# Patient Record
Sex: Female | Born: 1960 | Race: Black or African American | Hispanic: No | Smoking: Never smoker
Health system: Southern US, Community
[De-identification: ages and names within clinical notes are randomized; demographics above are authoritative.]

## PROBLEM LIST (undated history)

## (undated) DIAGNOSIS — D649 Anemia, unspecified: Secondary | ICD-10-CM

---

## 2007-09-29 ENCOUNTER — Emergency Department: Payer: Self-pay | Admitting: Emergency Medicine

## 2011-01-09 ENCOUNTER — Emergency Department (HOSPITAL_COMMUNITY)
Admission: EM | Admit: 2011-01-09 | Discharge: 2011-01-09 | Payer: Self-pay | Source: Home / Self Care | Admitting: Emergency Medicine

## 2012-03-22 ENCOUNTER — Encounter (HOSPITAL_COMMUNITY): Payer: Self-pay

## 2012-03-22 ENCOUNTER — Emergency Department (HOSPITAL_COMMUNITY)
Admission: EM | Admit: 2012-03-22 | Discharge: 2012-03-22 | Disposition: A | Discharge status: Home Health | Payer: Managed Care, Other (non HMO) | Attending: Emergency Medicine | Admitting: Emergency Medicine

## 2012-03-22 ENCOUNTER — Emergency Department (HOSPITAL_COMMUNITY): Payer: Managed Care, Other (non HMO)

## 2012-03-22 DIAGNOSIS — R059 Cough, unspecified: Secondary | ICD-10-CM

## 2012-03-22 DIAGNOSIS — D649 Anemia, unspecified: Secondary | ICD-10-CM | POA: Insufficient documentation

## 2012-03-22 DIAGNOSIS — J40 Bronchitis, not specified as acute or chronic: Secondary | ICD-10-CM

## 2012-03-22 DIAGNOSIS — R05 Cough: Secondary | ICD-10-CM

## 2012-03-22 HISTORY — DX: Anemia, unspecified: D64.9

## 2012-03-22 MED ORDER — AZITHROMYCIN 250 MG PO TABS: 250.0000 mg | ORAL_TABLET | Freq: Every day | ORAL | Status: AC

## 2012-03-22 MED ORDER — BENZONATATE 100 MG PO CAPS: 100.0000 mg | ORAL_CAPSULE | Freq: Three times a day (TID) | ORAL | Status: AC

## 2012-03-22 NOTE — ED Notes (Signed)
Cough with lt. Eye pain for 3 days

## 2012-03-22 NOTE — Discharge Instructions (Signed)
Cough, Adult  A cough is a reflex that helps clear your throat and airways. It can help heal the body or may be a reaction to an irritated airway. A cough may only last 2 or 3 weeks (acute) or may last more than 8 weeks (chronic).  CAUSES Acute cough:  Viral or bacterial infections.  Chronic cough:  Infections.   Allergies.   Asthma.   Post-nasal drip.   Smoking.   Heartburn or acid reflux.   Some medicines.   Chronic lung problems (COPD).   Cancer.  SYMPTOMS   Cough.   Fever.   Chest pain.   Increased breathing rate.   High-pitched whistling sound when breathing (wheezing).   Colored mucus that you cough up (sputum).  TREATMENT   A bacterial cough may be treated with antibiotic medicine.   A viral cough must run its course and will not respond to antibiotics.   Your caregiver may recommend other treatments if you have a chronic cough.  HOME CARE INSTRUCTIONS   Only take over-the-counter or prescription medicines for pain, discomfort, or fever as directed by your caregiver. Use cough suppressants only as directed by your caregiver.   Use a cold steam vaporizer or humidifier in your bedroom or home to help loosen secretions.   Sleep in a semi-upright position if your cough is worse at night.   Rest as needed.   Stop smoking if you smoke.  SEEK IMMEDIATE MEDICAL CARE IF:   You have pus in your sputum.   Your cough starts to worsen.   You cannot control your cough with suppressants and are losing sleep.   You begin coughing up blood.   You have difficulty breathing.   You develop pain which is getting worse or is uncontrolled with medicine.   You have a fever.  MAKE SURE YOU:   Understand these instructions.   Will watch your condition.   Will get help right away if you are not doing well or get worse.  Document Released: 06/03/2011 Document Revised: 11/24/2011 Document Reviewed: 06/03/2011 ExitCare Patient Information 2012 Exit[employer],  LL[employer].  RESOURCE GUIDE  Dental Problems  Patients with Medicaid: [facility]                     [facility] [address]                                           [address] Phone:  [phone]                                                   Phone:  [phone]  If unable to pay or uninsured, contact:  [facility] or [facility] to become qualified for the adult dental clinic.  Chronic Pain Problems Contact [facility]  [phone] Patients need to be referred by their primary care doctor.  Insufficient Money for Medicine Contact [employer]:  call "211" or [facility] 271-5999.  No Primary Care Doctor Call [facility]  [phone] Other agencies that provide inexpensive medical care    [facility]  [phone]    [facility]  [phone]      [facility]  [phone]    [facility]  [phone]    [facility]  [phone]    [facility]  [phone]  Psychological Services [facility]  [phone] [facility]  [phone] [facility]   [phone] (emergency services [phone])  Abuse/Neglect [place] [facility] Abuse Hotline (336) 641-3795 [place] [facility] Abuse Hotline 800-378-5315 (After Hours)  Emergency Shelter [facility] (336) 271-5985  [facility] (336) 275-9566 [facility] (704) 372-4663  MRSA Hotline #:   832-7006    [place] [facility]  [facility] [facility]  [employer]                           [facility] [address] [place]                     [address]         [address]  [place]                                               [place]                              [place] Phone:  [phone]                                  Phone:  [phone]                   Phone:   [phone]  [facility] Phone:  [phone]  [place] Child Abuse Hotline (336) 342-1394 (336) 342-3537 (After Hours)  

## 2012-03-22 NOTE — ED Notes (Signed)
Discharge instructions reviewed with pt; verbalizes understanding.  No questions asked; no further c/o's voiced.  Pt ambulatory to lobby.  NAD noted. 

## 2012-03-22 NOTE — ED Provider Notes (Signed)
History     CSN: [id number]  Arrival date & time 03/22/12  1710   First MD Initiated Contact with Patient 03/22/12 1729      Chief Complaint  Patient presents with  . Cough    (Consider location/radiation/quality/duration/timing/severity/associated sxs/prior treatment) HPI  50yoF previously healthy (no PMD) cough. Patient states she's experienced nonproductive cough for the past 2 days. She states her cough is getting worse. She's been taking TheraFlu without relief. She states that she feels short of breath only when coughing and that she's had several episodes of posttussive emesis. She denies abdominal pain, nausea, vomiting otherwise. She denies fevers, chills. There've been no sick contacts. She does complain of rhinorrhea without sore throat.  She c/o "headache behind my left eye". No change in vision. Denies eye pain, redness  ED Notes, ED Provider Notes from 03/22/12 0000 to 03/22/12 17:22:46       Kathe Becton, RN 03/22/2012 17:20      Cough with lt. Eye pain for 3 days     Past Medical History  Diagnosis Date  . Anemia     History reviewed. No pertinent past surgical history.  No family history on file.  History  Substance Use Topics  . Smoking status: Never Smoker   . Smokeless tobacco: Not on file  . Alcohol Use: No    OB History    Grav Para Term Preterm Abortions TAB SAB Ect Mult Living                  Review of Systems  All other systems reviewed and are negative.  except as noted HPI  Allergies  Review of patient's allergies indicates no known allergies.  Home Medications   Current Outpatient Rx  Name Route Sig Dispense Refill  . THERAFLU FLU/COLD PO Oral Take 1 packet by mouth at bedtime as needed.    . AZITHROMYCIN 250 MG PO TABS Oral Take 1 tablet (250 mg total) by mouth daily. Take first 2 tablets together, then 1 every day until finished. 6 tablet 0  . BENZONATATE 100 MG PO CAPS Oral Take 1 capsule (100 mg total) by mouth every 8 (eight)  hours. 21 capsule 0    BP 143/91  Pulse 81  Temp(Src) 98.9 F (37.2 C) (Oral)  Resp 18  SpO2 98%  LMP 12/23/2011  Physical Exam  Nursing note and vitals reviewed. Constitutional: She is oriented to person, place, and time. She appears well-developed.  HENT:  Head: Atraumatic.  Mouth/Throat: Oropharynx is clear and moist.       +nasal congestion  Eyes: Conjunctivae and EOM are normal. Pupils are equal, round, and reactive to light. Right eye exhibits no discharge. Left eye exhibits no discharge. No scleral icterus.  Neck: Normal range of motion. Neck supple.  Cardiovascular: Normal rate, regular rhythm, normal heart sounds and intact distal pulses.   Pulmonary/Chest: Effort normal and breath sounds normal. No respiratory distress. She has no wheezes. She has no rales.  Abdominal: Soft. She exhibits no distension. There is no tenderness. There is no rebound and no guarding.  Musculoskeletal: Normal range of motion.  Neurological: She is alert and oriented to person, place, and time.  Skin: Skin is warm and dry. No rash noted.  Psychiatric: She has a normal mood and affect.    ED Course  Procedures (including critical care time)  Labs Reviewed - No data to display Dg Chest 2 View  03/22/2012  *RADIOLOGY REPORT*  Clinical Data: Cough for  3 days.  CHEST - 2 VIEW  Comparison: None  Findings: The cardiac silhouette, mediastinal and hilar contours are within normal limits.  The lungs are clear.  No pleural effusion.  The bony thorax is intact.  IMPRESSION: No acute cardiopulmonary findings.  Original Report Authenticated By: P. Loralie Champagne, M.D.     1. Cough   2. Bronchitis       MDM  Cough x 3 days. Afebrile. VSS. CXR pending. Reassess.   CXR without pna. Azithro, tessalon, tylenol prn headache. Needs pmd referral.        Forbes Cellar, MD 03/22/12 1900

## 2012-12-30 ENCOUNTER — Encounter (HOSPITAL_COMMUNITY): Payer: Self-pay | Admitting: Emergency Medicine

## 2012-12-30 ENCOUNTER — Emergency Department (HOSPITAL_COMMUNITY)
Admission: EM | Admit: 2012-12-30 | Discharge: 2012-12-30 | Disposition: A | Discharge status: Home Health | Payer: Managed Care, Other (non HMO) | Attending: Emergency Medicine | Admitting: Emergency Medicine

## 2012-12-30 DIAGNOSIS — M79609 Pain in unspecified limb: Secondary | ICD-10-CM

## 2012-12-30 DIAGNOSIS — Z79899 Other long term (current) drug therapy: Secondary | ICD-10-CM | POA: Insufficient documentation

## 2012-12-30 DIAGNOSIS — L02419 Cutaneous abscess of limb, unspecified: Secondary | ICD-10-CM | POA: Insufficient documentation

## 2012-12-30 DIAGNOSIS — Z862 Personal history of diseases of the blood and blood-forming organs and certain disorders involving the immune mechanism: Secondary | ICD-10-CM | POA: Insufficient documentation

## 2012-12-30 DIAGNOSIS — L0390 Cellulitis, unspecified: Secondary | ICD-10-CM

## 2012-12-30 LAB — CBC WITH DIFFERENTIAL/PLATELET
Basophils Absolute: 0 10*3/uL (ref 0.0–0.1)
Basophils Relative: 0 % (ref 0–1)
Eosinophils Relative: 3 % (ref 0–5)
HCT: 32.4 % — ABNORMAL LOW (ref 36.0–46.0)
MCH: 26.9 pg (ref 26.0–34.0)
MCHC: 33.3 g/dL (ref 30.0–36.0)
MCV: 80.6 fL (ref 78.0–100.0)
Monocytes Absolute: 0.4 10*3/uL (ref 0.1–1.0)
RDW: 14.9 % (ref 11.5–15.5)

## 2012-12-30 LAB — BASIC METABOLIC PANEL
CO2: 23 mEq/L (ref 19–32)
Calcium: 9.2 mg/dL (ref 8.4–10.5)
Creatinine, Ser: 0.67 mg/dL (ref 0.50–1.10)
GFR calc Af Amer: >90 mL/min (ref >90)

## 2012-12-30 MED ORDER — ONDANSETRON 4 MG PO TBDP
ORAL_TABLET | ORAL | Status: AC
  Administered 2012-12-30: 4 mg
  Filled 2012-12-30: qty 1

## 2012-12-30 MED ORDER — ONDANSETRON HCL 4 MG/2ML IJ SOLN
4.0000 mg | Freq: Once | INTRAMUSCULAR | Status: AC
  Administered 2012-12-30: 4 mg via INTRAVENOUS
  Filled 2012-12-30: qty 2

## 2012-12-30 MED ORDER — SULFAMETHOXAZOLE-TRIMETHOPRIM 800-160 MG PO TABS: 1.0000 | ORAL_TABLET | Freq: Two times a day (BID) | ORAL | Status: DC

## 2012-12-30 MED ORDER — CEPHALEXIN 500 MG PO CAPS: 500.0000 mg | ORAL_CAPSULE | Freq: Four times a day (QID) | ORAL | Status: DC

## 2012-12-30 MED ORDER — OXYCODONE-ACETAMINOPHEN 5-325 MG PO TABS
2.0000 | ORAL_TABLET | Freq: Once | ORAL | Status: AC
  Administered 2012-12-30: 2 via ORAL
  Filled 2012-12-30: qty 2

## 2012-12-30 MED ORDER — ONDANSETRON HCL 4 MG PO TABS: 4.0000 mg | ORAL_TABLET | Freq: Four times a day (QID) | ORAL | Status: DC

## 2012-12-30 MED ORDER — CLINDAMYCIN PHOSPHATE 600 MG/50ML IV SOLN
600.0000 mg | Freq: Once | INTRAVENOUS | Status: AC
  Administered 2012-12-30: 600 mg via INTRAVENOUS
  Filled 2012-12-30: qty 50

## 2012-12-30 MED ORDER — HYDROCODONE-ACETAMINOPHEN 5-325 MG PO TABS: 2.0000 | ORAL_TABLET | ORAL | Status: DC | PRN

## 2012-12-30 NOTE — ED Provider Notes (Signed)
History  This chart was scribed for Chloe Octave, MD by Chloe Campbell, ED Scribe. This patient was seen in room B16C/B16C and the patient's care was started at 9:45 AM.  CSN: [id number]  Arrival date & time 12/30/12  4098   First MD Initiated Contact with Patient 12/30/12 0945      Chief Complaint  Patient presents with  . Leg Pain     The history is provided by the patient. No language interpreter was used.    Chloe Campbell is a 52 y.o. female who presents to the Emergency Department complaining of 3 days of gradual onset, gradually worsening, constant right lower leg redness with associated mild calf pain and warmth. She reports that the area originally started off as a small bump after shaving which came to a head 2 days ago. She states that she popped the head and had white purulent discharge expelled. She states that since then the area has continued to drainage and has gotten more red and painful. She states that the pain is aggravated by ambulating and improved with rest. She reports taking Excedrin with mild improvement in her pain. She denies CP, abdominal pain, fevers, nausea and emesis as associated symptoms. She has a h/o chronic anemia. She denies smoking, alcohol use and IV drug use.  Past Medical History  Diagnosis Date  . Anemia     History reviewed. No pertinent past surgical history.  History reviewed. No pertinent family history.  History  Substance Use Topics  . Smoking status: Never Smoker   . Smokeless tobacco: Not on file  . Alcohol Use: No     Review of Systems  A complete 10 system review of systems was obtained and all systems are negative except as noted in the HPI and PMH.   Allergies  Review of patient's allergies indicates no known allergies.  Home Medications   Current Outpatient Rx  Name  Route  Sig  Dispense  Refill  . CEPHALEXIN 500 MG PO CAPS   Oral   Take 1 capsule (500 mg total) by mouth 4 (four) times daily.   40 capsule   0   . HYDROCODONE-ACETAMINOPHEN 5-325 MG PO TABS   Oral   Take 2 tablets by mouth every 4 (four) hours as needed for pain.   6 tablet   0   . ONDANSETRON HCL 4 MG PO TABS   Oral   Take 1 tablet (4 mg total) by mouth every 6 (six) hours.   12 tablet   0   . SULFAMETHOXAZOLE-TRIMETHOPRIM 800-160 MG PO TABS   Oral   Take 1 tablet by mouth every 12 (twelve) hours.   20 tablet   0     Triage Vitals: BP 118/72  Pulse 62  Temp 98.2 F (36.8 C) (Oral)  Resp 16  Ht 5' 4.5" (1.638 m)  Wt 150 lb (68.04 kg)  BMI 25.35 kg/m2  SpO2 98%  Physical Exam  Nursing note and vitals reviewed. Constitutional: She is oriented to person, place, and time. She appears well-developed and well-nourished. No distress.  HENT:  Head: Normocephalic and atraumatic.  Eyes: Conjunctivae normal and EOM are normal. Pupils are equal, round, and reactive to light.  Neck: Normal range of motion. Neck supple. No tracheal deviation present.  Cardiovascular: Normal rate and regular rhythm.   Pulmonary/Chest: Effort normal and breath sounds normal. No respiratory distress.  Abdominal: Soft. There is no tenderness.  Musculoskeletal: Normal range of motion. She exhibits no edema and no  tenderness.  Neurological: She is alert and oriented to person, place, and time.  Skin: Skin is warm and dry.       6 cm area of erythema to right lower leg, central ruptured bola with serous drainage, 2+ DP and PT pulses, no calf tenderness or asymmetry  Psychiatric: She has a normal mood and affect. Her behavior is normal.    ED Course  Procedures (including critical care time)  DIAGNOSTIC STUDIES: Oxygen Saturation is 98% on room air, normal by my interpretation.    COORDINATION OF CARE: 9:50 AM-Discussed treatment plan which includes IV antibiotics and pain medications with pt at bedside and pt agreed to plan.   10:00 AM- Ordered 600 mg clindamycin IVBP and 2 5-325 Norco tablets  10:54 AM- Pt rechecked and is c/o  nausea but states that pain is improved with medications listed above. Will order antiemetic. Informed pt of negative lab work and discussed discharge plan of recheck in 2 to 3 days. Performed US at bedside, no abscess or fluid collection to drain. Pt is agreeable to plan.  11:00 AM- Ordered 4 mg Zofran injection   Labs Reviewed  CBC WITH DIFFERENTIAL - Abnormal; Notable for the following:    Hemoglobin 10.8 (*)     HCT 32.4 (*)     All other components within normal limits  BASIC METABOLIC PANEL - Abnormal; Notable for the following:    Potassium 3.4 (*)     Glucose, Bld 104 (*)     All other components within normal limits   No results found.   1. Cellulitis       MDM  Right leg pain for the past 4 days after picking at a lesion. No fever or vomiting. No weakness, numbness or tingling.. Cellulitis noted to right lower leg. Neurovascular intact distally no fluid collection on bedside ultrasound.  Given clindamycin in ED for cellulitis. WOund edges marked. No evidence of DVT.  Patient instructed to have recheck with PCP in 48 hours. Return to the ED sooner if fever, spreading redness, increasing pain or other concern.  I personally performed the services described in this documentation, which was scribed in my presence. The recorded information has been reviewed and is accurate.        Chloe Octave, MD 12/30/12 1758

## 2012-12-30 NOTE — Progress Notes (Signed)
VASCULAR LAB PRELIMINARY  PRELIMINARY  PRELIMINARY  PRELIMINARY  Right lower extremity venous Doppler completed.    Preliminary report:  There is no DVT or SVT noted in the right lower extremity.  Nkosi Cortright, RVT 12/30/2012, 10:27 AM

## 2012-12-30 NOTE — ED Notes (Signed)
During discharge process patient started vomiting. Dr Manus Gunning notified, ordered to give zofran ODT.

## 2012-12-30 NOTE — ED Notes (Signed)
Pt discharged to home with family. NAD.  

## 2012-12-30 NOTE — ED Notes (Signed)
Pt presents to ED via POV with c/o right leg pain. Pt reports she saw a white bump on her right lower leg Thursday and "picked at it" and applied alcohol and peroxide. Right lower leg noted to be red warm to touch and swollen. Small dime size sore noted with clear drainage. NAD. Right foot swollen.

## 2012-12-31 ENCOUNTER — Emergency Department (HOSPITAL_COMMUNITY)
Admission: EM | Admit: 2012-12-31 | Discharge: 2012-12-31 | Disposition: A | Discharge status: Home / Self Care | Payer: Managed Care, Other (non HMO) | Attending: Emergency Medicine | Admitting: Emergency Medicine

## 2012-12-31 ENCOUNTER — Encounter (HOSPITAL_COMMUNITY): Payer: Self-pay

## 2012-12-31 DIAGNOSIS — R110 Nausea: Secondary | ICD-10-CM | POA: Insufficient documentation

## 2012-12-31 DIAGNOSIS — Z79899 Other long term (current) drug therapy: Secondary | ICD-10-CM | POA: Insufficient documentation

## 2012-12-31 DIAGNOSIS — L02419 Cutaneous abscess of limb, unspecified: Secondary | ICD-10-CM | POA: Insufficient documentation

## 2012-12-31 DIAGNOSIS — Z862 Personal history of diseases of the blood and blood-forming organs and certain disorders involving the immune mechanism: Secondary | ICD-10-CM | POA: Insufficient documentation

## 2012-12-31 DIAGNOSIS — L0390 Cellulitis, unspecified: Secondary | ICD-10-CM

## 2012-12-31 DIAGNOSIS — L02415 Cutaneous abscess of right lower limb: Secondary | ICD-10-CM

## 2012-12-31 MED ORDER — IBUPROFEN 400 MG PO TABS
600.0000 mg | ORAL_TABLET | Freq: Once | ORAL | Status: AC
  Administered 2012-12-31: 600 mg via ORAL
  Filled 2012-12-31: qty 1

## 2012-12-31 MED ORDER — CEPHALEXIN 250 MG PO CAPS
500.0000 mg | ORAL_CAPSULE | Freq: Once | ORAL | Status: AC
  Administered 2012-12-31: 500 mg via ORAL
  Filled 2012-12-31: qty 2

## 2012-12-31 NOTE — ED Provider Notes (Signed)
History     CSN: [id number]  Arrival date & time 12/31/12  1532   First MD Initiated Contact with Patient 12/31/12 2112      Chief Complaint  Patient presents with  . Cellulitis    (Consider location/radiation/quality/duration/timing/severity/associated sxs/prior treatment) HPI Comments: Pt states that she first noticed the abscess on Thursday of last week when she showered and there was pain when the water hit her leg.  She said that it became increasingly painful after that and that on Friday she could no longer tolerate putting weight on the leg.  She said that there was a spot that looked like a pimple and that she "popped it" and that pus like material came out of it Friday night.  The leg became increasingly painful, swollen, and red over the weekend and she sought care last night in the ER where she was told that she had cellulitis and was given a prescription for Keflex.  She did not fill the prescription and the pain got worse today, prompting her to return to the ER.  She states that she has never had anything like this before and that standing seems to make the pain much worse.    The history is provided by the patient.    Past Medical History  Diagnosis Date  . Anemia     History reviewed. No pertinent past surgical history.  No family history on file.  History  Substance Use Topics  . Smoking status: Never Smoker   . Smokeless tobacco: Not on file  . Alcohol Use: No    OB History    Grav Para Term Preterm Abortions TAB SAB Ect Mult Living                  Review of Systems  Constitutional: Negative for fever and chills.  Gastrointestinal: Positive for nausea. Negative for vomiting.       PT states that she has felt nauseous as of today.  Skin: Positive for color change and wound.       Rt lower leg is erythematous, swollen, and has an approximately 1 cm wound in the center of the affected area.  Neurological: Negative for headaches.  All other systems  reviewed and are negative.    Allergies  Review of patient's allergies indicates no known allergies.  Home Medications   Current Outpatient Rx  Name  Route  Sig  Dispense  Refill  . CEPHALEXIN 500 MG PO CAPS   Oral   Take 1 capsule (500 mg total) by mouth 4 (four) times daily.   40 capsule   0   . HYDROCODONE-ACETAMINOPHEN 5-325 MG PO TABS   Oral   Take 2 tablets by mouth every 4 (four) hours as needed for pain.   6 tablet   0   . ONDANSETRON HCL 4 MG PO TABS   Oral   Take 1 tablet (4 mg total) by mouth every 6 (six) hours.   12 tablet   0   . SULFAMETHOXAZOLE-TRIMETHOPRIM 800-160 MG PO TABS   Oral   Take 1 tablet by mouth every 12 (twelve) hours.   20 tablet   0     BP 119/59  Pulse 65  Temp 98.8 F (37.1 C) (Oral)  Resp 18  SpO2 97%  LMP 12/23/2011  Physical Exam  Nursing note and vitals reviewed. Constitutional: She appears well-developed and well-nourished.  HENT:  Head: Normocephalic and atraumatic.  Eyes: Pupils are equal, round, and reactive to light.  Neck: Normal range of motion.  Cardiovascular: Normal rate and regular rhythm.   Pulmonary/Chest: Effort normal.  Musculoskeletal: Normal range of motion. She exhibits edema and tenderness.       Legs: Skin: Skin is warm. There is erythema.       Erythematous, swollen, painful right lower leg.    ED Course  INCISION AND DRAINAGE Date/Time: 12/31/2012 9:51 PM Performed by: Arman Filter Authorized by: Arman Filter Consent: Verbal consent obtained. Consent given by: patient Patient understanding: patient states understanding of the procedure being performed Patient consent: the patient's understanding of the procedure matches consent given Procedure consent: procedure consent matches procedure scheduled Test results: test results available and properly labeled Site marked: the operative site was marked Patient identity confirmed: verbally with patient Time out: Immediately prior to  procedure a "time out" was called to verify the correct patient, procedure, equipment, support staff and site/side marked as required. Type: abscess Body area: lower extremity Location details: right leg Anesthesia: local infiltration Local anesthetic: lidocaine 2% with epinephrine Anesthetic total: 4 ml Patient sedated: no Scalpel size: 11 Needle gauge: 22 (27 gauge) Incision type: single straight Complexity: simple Drainage: bloody Drainage amount: moderate Wound treatment: wound left open Packing material: 1/4 in iodoform gauze Patient tolerance: Patient tolerated the procedure well with no immediate complications. Comments: Pt was locally anesthetized with 4 mL of 2% lidocaine w epinephrine and a straight incision was made over the center of the abscess.  Hemostats were used to open and break up loculations and a mix of blood and purulent material was drained from the site.  The site was then packed with 1/4 iodoform gauze and covered with gauze.  Pt instructed to take Keflex as prescribed yesterday.   (including critical care time)  Labs Reviewed - No data to display No results found.   1. Abscess of right lower leg   2. Cellulitis       MDM  Cellulitis not improved, now abscess.  I and D performed with moderate amount of exudate. DC'd home and encouraged to fill Rx for Keflex and to remove packing in two days.      Arman Filter, NP 12/31/12 2212  Arman Filter, NP 12/31/12 1610  Arman Filter, NP 12/31/12 2213

## 2012-12-31 NOTE — ED Notes (Signed)
Pt was seen here yesterday and dx with cellulitis to her right lower leg. Now presents with open wound to the lateral lower leg which is weeping. Was given an antibiotic yesterday. Back for the increase in pain, redness and swelling as well as weeping.

## 2013-01-01 NOTE — ED Provider Notes (Signed)
Medical screening examination/treatment/procedure(s) were performed by non-physician practitioner and as supervising physician I was immediately available for consultation/collaboration.  Christopher J. Pollina, MD 01/01/13 2359 

## 2013-01-03 ENCOUNTER — Emergency Department (HOSPITAL_COMMUNITY)
Admission: EM | Admit: 2013-01-03 | Discharge: 2013-01-03 | Disposition: A | Discharge status: Home / Self Care | Payer: Managed Care, Other (non HMO) | Attending: Emergency Medicine | Admitting: Emergency Medicine

## 2013-01-03 ENCOUNTER — Encounter (HOSPITAL_COMMUNITY): Payer: Self-pay

## 2013-01-03 DIAGNOSIS — L02415 Cutaneous abscess of right lower limb: Secondary | ICD-10-CM

## 2013-01-03 DIAGNOSIS — L02419 Cutaneous abscess of limb, unspecified: Secondary | ICD-10-CM | POA: Insufficient documentation

## 2013-01-03 DIAGNOSIS — Z4801 Encounter for change or removal of surgical wound dressing: Secondary | ICD-10-CM | POA: Insufficient documentation

## 2013-01-03 DIAGNOSIS — Z862 Personal history of diseases of the blood and blood-forming organs and certain disorders involving the immune mechanism: Secondary | ICD-10-CM | POA: Insufficient documentation

## 2013-01-03 DIAGNOSIS — L03119 Cellulitis of unspecified part of limb: Secondary | ICD-10-CM | POA: Insufficient documentation

## 2013-01-03 MED ORDER — CLINDAMYCIN HCL 150 MG PO CAPS: 150.0000 mg | ORAL_CAPSULE | Freq: Four times a day (QID) | ORAL | Status: DC

## 2013-01-03 MED ORDER — LIDOCAINE-EPINEPHRINE 2 %-1:100000 IJ SOLN
10.0000 mL | Freq: Once | INTRAMUSCULAR | Status: AC
  Administered 2013-01-03: 10 mL via INTRADERMAL
  Filled 2013-01-03: qty 10

## 2013-01-03 MED ORDER — HYDROCODONE-ACETAMINOPHEN 5-325 MG PO TABS: 2.0000 | ORAL_TABLET | ORAL | Status: DC | PRN

## 2013-01-03 NOTE — ED Notes (Signed)
Pt has open draining wound, approx the size of a nickel on right tibial area.

## 2013-01-03 NOTE — ED Notes (Signed)
PA at bedside.

## 2013-01-03 NOTE — ED Provider Notes (Signed)
History     CSN: [id number]  Arrival date & time 01/03/13  1143   First MD Initiated Contact with Patient 01/03/13 1330      No chief complaint on file.   (Consider location/radiation/quality/duration/timing/severity/associated sxs/prior treatment) HPI  52 year old female presents for wound recheck.  Pt has an abscess to R anterior tibia region which was I&D 3 days ago with packing placed.  She has been taking Keflex.  Pt was told to remove packing in 2 days, which she did.  Currently c/o increasing pain, and swelling to affected site, difficult to walk due to pain.  Onset gradual, persistent, moderate in severity, non radiating.  Denies fever, chills, recent trauma, hx of recurrent abscess or hx of diabetes.    Past Medical History  Diagnosis Date  . Anemia     History reviewed. No pertinent past surgical history.  History reviewed. No pertinent family history.  History  Substance Use Topics  . Smoking status: Never Smoker   . Smokeless tobacco: Not on file  . Alcohol Use: No    OB History    Grav Para Term Preterm Abortions TAB SAB Ect Mult Living                  Review of Systems  Constitutional:       10 Systems reviewed and all are negative for acute change except as noted in the HPI.     Allergies  Review of patient's allergies indicates no known allergies.  Home Medications   Current Outpatient Rx  Name  Route  Sig  Dispense  Refill  . CEPHALEXIN 500 MG PO CAPS   Oral   Take 500 mg by mouth 4 (four) times daily. Starting on 12/30/12         . ONDANSETRON HCL 4 MG PO TABS   Oral   Take 4 mg by mouth every 6 (six) hours as needed. For nausea         . SULFAMETHOXAZOLE-TMP DS 800-160 MG PO TABS   Oral   Take 1 tablet by mouth every 12 (twelve) hours. Starting 12/30/12           BP 103/72  Pulse 64  Temp 97.6 F (36.4 C) (Oral)  Resp 18  SpO2 100%  LMP 12/23/2011  Physical Exam  Nursing note and vitals reviewed. Constitutional: She  is oriented to person, place, and time. She appears well-developed and well-nourished. No distress.  HENT:  Head: Atraumatic.  Neck: Neck supple.  Musculoskeletal: Normal range of motion. She exhibits tenderness (R anterior tibia region: a dime size lesion without pustular exudates or significant fluctuance.  ttp).  Neurological: She is alert and oriented to person, place, and time.  Skin: Skin is warm.  Psychiatric: She has a normal mood and affect.    ED Course  Procedures (including critical care time)  Labs Reviewed - No data to display No results found.   No diagnosis found.  INCISION AND DRAINAGE Performed by: Fayrene Helper Consent: Verbal consent obtained. Risks and benefits: risks, benefits and alternatives were discussed Type: abscess  Body area: R lower leg  Anesthesia: local infiltration  Incision was made with a scalpel.  Local anesthetic: lidocaine 2% w epinephrine  Anesthetic total: 6 ml  Complexity: complex Blunt dissection to break up loculations  Drainage: blood  Drainage amount: mild  Packing material: 1/4 in iodoform gauze  Patient tolerance: Patient tolerated the procedure well with no immediate complications.   1. Abscess, R  lower leg  MDM  Pt is here for recheck of abscess to R lower leg s/p I&D 3 days ago.  Pt reports worsening swelling and pain, and request re-I&D.  Will perform I&D again.    2:38 PM Wound were re I&D without pustular exudates.  Packing placed.  Will switch Keflex to Clindamycin.  Recommend warm compress, rest, remove packing in 2 days and return for further evaluation if pain persists.  Pt voice understanding and agrees with plan.     BP 103/72  Pulse 64  Temp 97.6 F (36.4 C) (Oral)  Resp 18  SpO2 100%  LMP 12/23/2011  I have reviewed nursing notes and vital signs.  I reviewed available ER/hospitalization records thought the EMR    Fayrene Helper, [place] 01/03/13 1518

## 2013-01-03 NOTE — ED Notes (Signed)
Pt presents for follow evaluation of abscess to R lower leg.  Pt seen here on Monday, reports compliance with abx, reports packing was pulled yesterday.  Pt reports area continues to drain, reports another area above site that has become sore, denies drainage to 2nd site.

## 2013-01-04 NOTE — ED Provider Notes (Signed)
Medical screening examination/treatment/procedure(s) were performed by non-physician practitioner and as supervising physician I was immediately available for consultation/collaboration.   Loren Racer, MD 01/04/13 [phone]

## 2013-06-26 ENCOUNTER — Emergency Department: Payer: Self-pay | Admitting: Emergency Medicine

## 2013-09-10 ENCOUNTER — Encounter (HOSPITAL_COMMUNITY): Payer: Self-pay | Admitting: *Deleted

## 2013-09-10 ENCOUNTER — Emergency Department (HOSPITAL_COMMUNITY)
Admission: EM | Admit: 2013-09-10 | Discharge: 2013-09-10 | Discharge status: Against Medical Advice | Payer: Managed Care, Other (non HMO) | Attending: Emergency Medicine | Admitting: Emergency Medicine

## 2013-09-10 DIAGNOSIS — K644 Residual hemorrhoidal skin tags: Secondary | ICD-10-CM | POA: Insufficient documentation

## 2013-09-10 DIAGNOSIS — Z792 Long term (current) use of antibiotics: Secondary | ICD-10-CM | POA: Insufficient documentation

## 2013-09-10 DIAGNOSIS — Z862 Personal history of diseases of the blood and blood-forming organs and certain disorders involving the immune mechanism: Secondary | ICD-10-CM | POA: Insufficient documentation

## 2013-09-10 LAB — COMPREHENSIVE METABOLIC PANEL
AST: 20 U/L (ref 0–37)
Albumin: 3.9 g/dL (ref 3.5–5.2)
Chloride: 107 mEq/L (ref 96–112)
Creatinine, Ser: 0.79 mg/dL (ref 0.50–1.10)
Total Bilirubin: 0.2 mg/dL — ABNORMAL LOW (ref 0.3–1.2)
Total Protein: 7.4 g/dL (ref 6.0–8.3)

## 2013-09-10 LAB — CBC
MCV: 81 fL (ref 78.0–100.0)
Platelets: 263 10*3/uL (ref 150–400)
RDW: 15.9 % — ABNORMAL HIGH (ref 11.5–15.5)
WBC: 4.3 10*3/uL (ref 4.0–10.5)

## 2013-09-10 NOTE — ED Notes (Signed)
Pt is here with complaints of 3 day history of passing blood rectally.  Pt denies any stool.  Pt states yesterday she has more clots.  Pt is having lower abdominal pain

## 2013-09-10 NOTE — ED Provider Notes (Signed)
CSN: [id number]     Arrival date & time 09/10/13  1810 History   First MD Initiated Contact with Patient 09/10/13 2049     No chief complaint on file.  (Consider location/radiation/quality/duration/timing/severity/associated sxs/prior Treatment) HPI Comments: Pt comes in with cc of blood per rectum x 3 days. She typically notices the blood when she wipes herself. There is no diarrhea, no hx of GERD, no abd pain, no heavy ASA use, alcohol abuse, liver dz. No hx of constipation.   The history is provided by the patient.    Past Medical History  Diagnosis Date  . Anemia    History reviewed. No pertinent past surgical history. No family history on file. History  Substance Use Topics  . Smoking status: Never Smoker   . Smokeless tobacco: Not on file  . Alcohol Use: No   OB History   Grav Para Term Preterm Abortions TAB SAB Ect Mult Living                 Review of Systems  Constitutional: Positive for activity change.  HENT: Negative for neck pain.   Respiratory: Negative for shortness of breath.   Cardiovascular: Negative for chest pain.  Gastrointestinal: Positive for blood in stool. Negative for nausea, vomiting and abdominal pain.  Genitourinary: Negative for dysuria.  Neurological: Negative for headaches.    Allergies  Review of patient's allergies indicates no known allergies.  Home Medications   Current Outpatient Rx  Name  Route  Sig  Dispense  Refill  . clindamycin (CLEOCIN) 150 MG capsule   Oral   Take 1 capsule (150 mg total) by mouth every 6 (six) hours.   14 capsule   0    BP 116/58  Pulse 67  Temp(Src) 98.3 F (36.8 C) (Oral)  Resp 16  SpO2 99%  LMP 12/23/2011 Physical Exam  Nursing note and vitals reviewed. Constitutional: She is oriented to person, place, and time. She appears well-developed and well-nourished.  HENT:  Head: Normocephalic and atraumatic.  Eyes: EOM are normal. Pupils are equal, round, and reactive to light.  Neck: Neck  supple.  Cardiovascular: Normal rate, regular rhythm and normal heart sounds.   No murmur heard. Pulmonary/Chest: Effort normal. No respiratory distress.  Abdominal: Soft. She exhibits no distension. There is no tenderness. There is no rebound and no guarding.  DRE reveals large hemorrhoidal skin tags with an area of bloody appearing mucosa. The DRE reveals no melena, no BRBPR  Neurological: She is alert and oriented to person, place, and time.  Skin: Skin is warm and dry.    ED Course  Procedures (including critical care time) Labs Review Labs Reviewed  CBC - Abnormal; Notable for the following:    RBC 3.84 (*)    Hemoglobin 10.2 (*)    HCT 31.1 (*)    RDW 15.9 (*)    All other components within normal limits  COMPREHENSIVE METABOLIC PANEL - Abnormal; Notable for the following:    Alkaline Phosphatase 137 (*)    Total Bilirubin 0.2 (*)    All other components within normal limits  OCCULT BLOOD, POC DEVICE - Abnormal; Notable for the following:    Fecal Occult Bld POSITIVE (*)    All other components within normal limits   Imaging Review No results found.  MDM   1. Hemorrhoidal skin tags    DDx includes: PUD/Gastritis/ulcers Diverticular bleed Colon cancer Rectal bleed Internal hemorrhoids External hemorrhoids  Pt's exam consistent with external hemorrhoids. Her abd exam  is benign. Advocated SITZ bath and Stool softners. Hb is stable.  11:30 PM Pt had left prior to nurses discharge. Left after MD evaluation.   Derwood Kaplan, MD 09/10/13 2330

## 2014-02-16 ENCOUNTER — Encounter (HOSPITAL_COMMUNITY): Payer: Self-pay | Admitting: Emergency Medicine

## 2014-02-16 ENCOUNTER — Emergency Department (HOSPITAL_COMMUNITY)
Admission: EM | Admit: 2014-02-16 | Discharge: 2014-02-17 | Disposition: A | Discharge status: Home / Self Care | Payer: Managed Care, Other (non HMO) | Attending: Emergency Medicine | Admitting: Emergency Medicine

## 2014-02-16 DIAGNOSIS — R1011 Right upper quadrant pain: Secondary | ICD-10-CM | POA: Insufficient documentation

## 2014-02-16 DIAGNOSIS — R1013 Epigastric pain: Secondary | ICD-10-CM | POA: Insufficient documentation

## 2014-02-16 DIAGNOSIS — Z862 Personal history of diseases of the blood and blood-forming organs and certain disorders involving the immune mechanism: Secondary | ICD-10-CM | POA: Insufficient documentation

## 2014-02-16 DIAGNOSIS — R42 Dizziness and giddiness: Secondary | ICD-10-CM | POA: Insufficient documentation

## 2014-02-16 LAB — CBC WITH DIFFERENTIAL/PLATELET
BASOS ABS: 0 10*3/uL (ref 0.0–0.1)
BASOS PCT: 0 % (ref 0–1)
Eosinophils Absolute: 0.1 10*3/uL (ref 0.0–0.7)
Eosinophils Relative: 3 % (ref 0–5)
HCT: 32 % — ABNORMAL LOW (ref 36.0–46.0)
Hemoglobin: 10 g/dL — ABNORMAL LOW (ref 12.0–15.0)
Lymphocytes Relative: 45 % (ref 12–46)
Lymphs Abs: 2 10*3/uL (ref 0.7–4.0)
MCH: 22.7 pg — ABNORMAL LOW (ref 26.0–34.0)
MCHC: 31.3 g/dL (ref 30.0–36.0)
MCV: 72.7 fL — ABNORMAL LOW (ref 78.0–100.0)
Monocytes Absolute: 0.3 10*3/uL (ref 0.1–1.0)
Monocytes Relative: 7 % (ref 3–12)
NEUTROS ABS: 2.1 10*3/uL (ref 1.7–7.7)
NEUTROS PCT: 46 % (ref 43–77)
PLATELETS: 271 10*3/uL (ref 150–400)
RBC: 4.4 MIL/uL (ref 3.87–5.11)
RDW: 20.8 % — AB (ref 11.5–15.5)
WBC: 4.6 10*3/uL (ref 4.0–10.5)

## 2014-02-16 LAB — COMPREHENSIVE METABOLIC PANEL
ALBUMIN: 3.7 g/dL (ref 3.5–5.2)
ALK PHOS: 134 U/L — AB (ref 39–117)
ALT: 15 U/L (ref 0–35)
AST: 17 U/L (ref 0–37)
BUN: 16 mg/dL (ref 6–23)
CHLORIDE: 95 meq/L — AB (ref 96–112)
CO2: 27 mEq/L (ref 19–32)
Calcium: 9.4 mg/dL (ref 8.4–10.5)
Creatinine, Ser: 0.71 mg/dL (ref 0.50–1.10)
GFR calc Af Amer: >90 mL/min (ref >90)
GFR calc non Af Amer: >90 mL/min (ref >90)
Glucose, Bld: 94 mg/dL (ref 70–99)
POTASSIUM: 4.2 meq/L (ref 3.7–5.3)
Sodium: 146 mEq/L (ref 137–147)
Total Bilirubin: 0.2 mg/dL — ABNORMAL LOW (ref 0.3–1.2)
Total Protein: 7.7 g/dL (ref 6.0–8.3)

## 2014-02-16 LAB — I-STAT TROPONIN, ED: TROPONIN I, POC: 0.01 ng/mL (ref 0.00–0.08)

## 2014-02-16 LAB — LIPASE, BLOOD: Lipase: 19 U/L (ref 11–59)

## 2014-02-16 MED ORDER — FAMOTIDINE 20 MG PO TABS
20.0000 mg | ORAL_TABLET | Freq: Once | ORAL | Status: AC
  Administered 2014-02-16: 20 mg via ORAL
  Filled 2014-02-16: qty 1

## 2014-02-16 MED ORDER — GI COCKTAIL ~~LOC~~
30.0000 mL | Freq: Once | ORAL | Status: AC
  Administered 2014-02-16: 30 mL via ORAL
  Filled 2014-02-16: qty 30

## 2014-02-16 MED ORDER — ESOMEPRAZOLE MAGNESIUM 40 MG PO CPDR: 40.0000 mg | DELAYED_RELEASE_CAPSULE | Freq: Every day | ORAL | Status: DC

## 2014-02-16 MED ORDER — HYDROCODONE-ACETAMINOPHEN 5-325 MG PO TABS
1.0000 | ORAL_TABLET | Freq: Once | ORAL | Status: AC
  Administered 2014-02-17: 1 via ORAL
  Filled 2014-02-16: qty 1

## 2014-02-16 NOTE — ED Notes (Signed)
Patient with abdominal pain and lightheadness for last two weeks.  Patient states denies any nausea or vomiting at this time.

## 2014-02-16 NOTE — Discharge Instructions (Signed)
Return for chest pain, sweating, worsening pain, pain radiating to the back, fevers or other concerns. If you were given medicines take as directed.  If you are on coumadin or contraceptives realize their levels and effectiveness is altered by many different medicines.  If you have any reaction (rash, tongues swelling, other) to the medicines stop taking and see a physician.   Please follow up as directed and return to the ER or see a physician for new or worsening symptoms.  Thank you.

## 2014-02-16 NOTE — ED Notes (Signed)
Pt reports epigastric pain "burning" off and on over past two-three weeks.  Mylanta and pepto bismol does not help.  Eating and drinking makes stomach burn worse.

## 2014-02-16 NOTE — ED Notes (Signed)
Pt reports GI Cocktail improving her epigastric pain.

## 2014-02-16 NOTE — ED Notes (Signed)
Patient asked for urine sample. States that she is unable at this time.

## 2014-02-16 NOTE — ED Notes (Signed)
Pt given water po.  Drinking without difficulty.  Reports epigastric region continues to burn, now rates 5/10.

## 2014-02-16 NOTE — ED Provider Notes (Signed)
CSN: [id number]     Arrival date & time 02/16/14  2007 History   First MD Initiated Contact with Patient 02/16/14 2209     Chief Complaint  Patient presents with  . Dizziness  . Abdominal Pain     (Consider location/radiation/quality/duration/timing/severity/associated sxs/prior Treatment) HPI Comments: 53 yo female with no significant medical hx, no cardiac hx, no ulcer hx presents with epig burning discomfort, constant for two days but has had for two weeks, worse after eating.  Mild radiation to central chest, no back radiation or tearing, mild hx of similar.  Minimal nsaids.  No ulcer hx.  Non smoker.  No cardiac risk factors.  No hx of gerd known.  No GB hx.  Patient is a 53 y.o. female presenting with dizziness and abdominal pain. The history is provided by the patient.  Dizziness Associated symptoms: no blood in stool, no chest pain, no headaches, no nausea, no shortness of breath and no vomiting   Abdominal Pain Associated symptoms: no chest pain, no chills, no dysuria, no fever, no nausea, no shortness of breath and no vomiting     Past Medical History  Diagnosis Date  . Anemia    History reviewed. No pertinent past surgical history. History reviewed. No pertinent family history. History  Substance Use Topics  . Smoking status: Never Smoker   . Smokeless tobacco: Not on file  . Alcohol Use: No   OB History   Grav Para Term Preterm Abortions TAB SAB Ect Mult Living                 Review of Systems  Constitutional: Negative for fever and chills.  HENT: Negative for congestion.   Eyes: Negative for visual disturbance.  Respiratory: Negative for shortness of breath.   Cardiovascular: Negative for chest pain.  Gastrointestinal: Positive for abdominal pain. Negative for nausea, vomiting and blood in stool.  Genitourinary: Negative for dysuria and flank pain.  Musculoskeletal: Negative for back pain, neck pain and neck stiffness.  Skin: Negative for rash.   Neurological: Positive for light-headedness. Negative for dizziness and headaches.      Allergies  Review of patient's allergies indicates no known allergies.  Home Medications   Current Outpatient Rx  Name  Route  Sig  Dispense  Refill  . bismuth subsalicylate (PEPTO BISMOL) 262 MG/15ML suspension   Oral   Take 30 mLs by mouth every 6 (six) hours as needed for indigestion.         Marland Kitchen ibuprofen (ADVIL,MOTRIN) 200 MG tablet   Oral   Take 200 mg by mouth every 6 (six) hours as needed for fever or moderate pain.          BP 154/77  Temp(Src) 97.5 F (36.4 C) (Oral)  Resp 18  Ht 5\' 4"  (1.626 m)  SpO2 97%  LMP 12/22/2005 Physical Exam  Nursing note and vitals reviewed. Constitutional: She is oriented to person, place, and time. She appears well-developed and well-nourished.  HENT:  Head: Normocephalic and atraumatic.  Eyes: Conjunctivae are normal. Right eye exhibits no discharge. Left eye exhibits no discharge.  Neck: Normal range of motion. Neck supple. No tracheal deviation present.  Cardiovascular: Normal rate, regular rhythm and intact distal pulses.   No murmur heard. Pulmonary/Chest: Effort normal and breath sounds normal.  Abdominal: Soft. She exhibits no distension. There is tenderness (epig). There is no guarding.  Musculoskeletal: She exhibits no edema.  Neurological: She is alert and oriented to person, place, and time.  Skin:  Skin is warm. No rash noted.  Psychiatric: She has a normal mood and affect.    ED Course  Procedures (including critical care time)  EMERGENCY DEPARTMENT BILIARY ULTRASOUND INTERPRETATION "Study: Limited Abdominal Ultrasound of the gallbladder and common bile duct."  INDICATIONS: Abdominal pain and RUQ pain Indication: Multiple views of the gallbladder and common bile duct were obtained in real-time with a Multi-frequency probe." PERFORMED BY:  Myself IMAGES ARCHIVED?: Yes FINDINGS: Gallstones absent, Gallbladder wall normal in  thickness, Sonographic Murphy's sign absent and Common bile duct normal in size LIMITATIONS: Bowel Gas INTERPRETATION: Normal  EMERGENCY DEPARTMENT ULTRASOUND  Study: Limited Retroperitoneal Ultrasound of the Abdominal Aorta.  INDICATIONS:Abdominal pain and Age>55 Multiple views of the abdominal aorta were obtained in real-time from the diaphragmatic hiatus to the aortic bifurcation in transverse planes with a multi-frequency probe. PERFORMED BY: Myself IMAGES ARCHIVED?: Yes FINDINGS: Maximum aortic dimensions are normal LIMITATIONS:  Bowel gas INTERPRETATION:  No abdominal aortic aneurysm    Labs Review Labs Reviewed  CBC WITH DIFFERENTIAL - Abnormal; Notable for the following:    Hemoglobin 10.0 (*)    HCT 32.0 (*)    MCV 72.7 (*)    MCH 22.7 (*)    RDW 20.8 (*)    All other components within normal limits  COMPREHENSIVE METABOLIC PANEL - Abnormal; Notable for the following:    Chloride 95 (*)    Alkaline Phosphatase 134 (*)    Total Bilirubin 0.2 (*)    All other components within normal limits  LIPASE, BLOOD  PREGNANCY, URINE  I-STAT TROPOININ, ED  POC URINE PREG, ED   Imaging Review No results found.   EKG Interpretation   Date/Time:  Sunday February 16 2014 22:42:17 EST Ventricular Rate:  58 PR Interval:  142 QRS Duration: 84 QT Interval:  410 QTC Calculation: 403 R Axis:   -13 Text Interpretation:  Sinus rhythm Left ventricular hypertrophy No acute  findings Confirmed by Roni Scow  MD, Renezmae Canlas ([id number]) on 02/16/2014 11:06:07 PM      MDM   Final diagnoses:  Epigastric pain   Clinically gerd or gastric origin, possibly biliary and less likely cardiac or emergent cause with constant sxs 2 days, well appearing and worse with eating.  [facility], both unremarkable. Screening ekg and troponin.  GI meds given.  Pt improved however burning pain persists, norco. Discussed pcp/ GI fup and strict reasons to return.  With pain constant for 2  wks I do not feel she needs emergent CT or admission at this time. Results and differential diagnosis were discussed with the patient. Close follow up outpatient was discussed, patient comfortable with the plan.     Enid SkeensJoshua M Dorse Locy, MD 02/16/14 [phone]

## 2014-04-29 ENCOUNTER — Emergency Department: Payer: Self-pay | Admitting: Emergency Medicine

## 2014-04-30 ENCOUNTER — Encounter (HOSPITAL_COMMUNITY): Payer: Self-pay | Admitting: Emergency Medicine

## 2014-04-30 ENCOUNTER — Emergency Department (HOSPITAL_COMMUNITY)
Admission: EM | Admit: 2014-04-30 | Discharge: 2014-04-30 | Disposition: A | Discharge status: Home / Self Care | Payer: Managed Care, Other (non HMO) | Attending: Emergency Medicine | Admitting: Emergency Medicine

## 2014-04-30 DIAGNOSIS — Z862 Personal history of diseases of the blood and blood-forming organs and certain disorders involving the immune mechanism: Secondary | ICD-10-CM | POA: Insufficient documentation

## 2014-04-30 DIAGNOSIS — M5430 Sciatica, unspecified side: Secondary | ICD-10-CM | POA: Insufficient documentation

## 2014-04-30 DIAGNOSIS — Z79899 Other long term (current) drug therapy: Secondary | ICD-10-CM | POA: Insufficient documentation

## 2014-04-30 MED ORDER — HYDROCODONE-ACETAMINOPHEN 5-325 MG PO TABS: 1.0000 | ORAL_TABLET | ORAL | Status: DC | PRN

## 2014-04-30 MED ORDER — PREDNISONE 20 MG PO TABS
60.0000 mg | ORAL_TABLET | Freq: Once | ORAL | Status: AC
  Administered 2014-04-30: 60 mg via ORAL
  Filled 2014-04-30: qty 3

## 2014-04-30 MED ORDER — CYCLOBENZAPRINE HCL 10 MG PO TABS: 10.0000 mg | ORAL_TABLET | Freq: Two times a day (BID) | ORAL | Status: DC | PRN

## 2014-04-30 MED ORDER — PREDNISONE 10 MG PO TABS: 20.0000 mg | ORAL_TABLET | Freq: Every day | ORAL | Status: DC

## 2014-04-30 NOTE — ED Notes (Signed)
Refused to wait after medication given.

## 2014-04-30 NOTE — ED Notes (Signed)
Pt states lower right back pain that is going into her buttox.  Pt reports no change in urinary habits.  Pt report walking or reaching for something makes the pain worse

## 2014-04-30 NOTE — ED Provider Notes (Signed)
CSN: [id number]     Arrival date & time 04/30/14  1949 History    Chief Complaint  Patient presents with  . Back Pain   Patient is a 53 y.o. female presenting with back pain.  Back Pain Associated symptoms: no weakness    This chart was scribed for non-physician practitioner working with No att. providers found, by Andrew Auaven Small, ED Scribe. This patient was seen in room TR09C/TR09C and the patient's care was started at 7:07 AM.  Concepcion ElkDeborah Somerville is a 53 y.o. female who presents to the Emergency Department complaining of lower back pain onset 3 days ago. Pt reports that pain began at work. 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 She reports that she was walking when she first felt the pain.  She reports that this is a new issue. She reports that. She describes the pain as throbbing. Pt reports that pain radiates down right leg and stops at her knee. Pt denies weakness in right leg.   She reports she has a limp with walking.  Pt denies BM, HTN and asthma. Pt reports that she drove herself to the ED.  Pt does not have PCP.   Past Medical History  Diagnosis Date  . Anemia    History reviewed. No pertinent past surgical history. No family history on file. History  Substance Use Topics  . Smoking status: Never Smoker   . Smokeless tobacco: Not on file  . Alcohol Use: No   OB History   Grav Para Term Preterm Abortions TAB SAB Ect Mult Living                 Review of Systems  Musculoskeletal: Positive for back pain.  Neurological: Negative for weakness.    Allergies  Review of patient's allergies indicates no known allergies.  Home Medications   Prior to Admission medications   Medication Sig Start Date End Date Taking? Authorizing Provider  bismuth subsalicylate (PEPTO BISMOL) 262 MG/15ML suspension Take 30 mLs by mouth every 6 (six) hours as needed for indigestion.    Historical Provider, MD  esomeprazole (NEXIUM) 40 MG capsule Take 1 capsule (40  mg total) by mouth daily. 02/16/14   Enid SkeensJoshua M Zavitz, MD  ibuprofen (ADVIL,MOTRIN) 200 MG tablet Take 200 mg by mouth every 6 (six) hours as needed for fever or moderate pain.    Historical Provider, MD   BP 128/80  Pulse 60  Temp(Src) 98.3 F (36.8 C) (Oral)  Resp 19  Ht 5' 4.5" (1.638 m)  Wt 184 lb (83.462 kg)  BMI 31.11 kg/m2  SpO2 99%  LMP 12/22/2005 Physical Exam  Nursing note and vitals reviewed. Constitutional: She is oriented to person, place, and time. She appears well-developed and well-nourished. No distress.  HENT:  Head: Normocephalic and atraumatic.  Eyes: EOM are normal.  Neck: Neck supple.  Cardiovascular: Normal rate.   Pulmonary/Chest: Effort normal. No respiratory distress.  Musculoskeletal: Normal range of motion.  Pt has equal strength to bilateral lower extremities.  Neurosensory function adequate to both legs No clonus on dorsiflextion Skin color is normal. Skin is warm and moist.  I see no step off deformity, no midline bony tenderness.  Pt is able to ambulate.  No crepitus, laceration, effusion, induration, lesions, swelling.   Pedal pulses are symmetrical and palpable bilaterally  no tenderness to palpation of paraspinel muscles or midline tenderness of her right hip   Neurological: She is alert and oriented to  person, place, and time.  Skin: Skin is warm and dry.  Psychiatric: She has a normal mood and affect. Her behavior is normal.   ED Course  Procedures  COORDINATION OF CARE: 7:07 AM-Discussed treatment plan which includes 5 day dose of prednisone and a muscle relaxer. Pt is to come back if symptoms worsen including fever and chills.   Labs Review Labs Reviewed - No data to display  Imaging Review No results found.   EKG Interpretation None      MDM   Final diagnoses:  Sciatica    52 y.o.Albertina Parreborah Pretty's  with back pain. No neurological deficits and normal neuro exam. Patient can walk but states is painful. No loss of bowel or  bladder control. No concern for cauda equina. No fever, night sweats, weight loss, h/o cancer, IVDU. RICE protocol and pain medicine indicated and discussed with patient.   Patient Plan 1. Medications: pain medication, muscle relaxer and usual home medications  2. Treatment: rest, drink plenty of fluids, gentle stretching as discussed, alternate ice and heat  3. Follow Up: Please followup with your primary doctor for discussion of your diagnoses and further evaluation after today's visit; if you do not have a primary care doctor use the resource guide provided to find one   Vital signs are stable at discharge. Filed Vitals:   04/30/14 2158  BP: 128/80  Pulse: 60  Temp: 98.3 F (36.8 C)  Resp: 19    Patient/guardian has voiced understanding and agreed to follow-up with the PCP or specialist.     I personally performed the services described in this documentation, which was scribed in my presence. The recorded information has been reviewed and is accurate.     Dorthula Matasiffany G Jachelle Fluty, PA-C 05/03/14 [phone]

## 2014-04-30 NOTE — Discharge Instructions (Signed)
Sciatica °Sciatica is pain, weakness, numbness, or tingling along the path of the sciatic nerve. The nerve starts in the lower back and runs down the back of each leg. The nerve controls the muscles in the lower leg and in the back of the knee, while also providing sensation to the back of the thigh, lower leg, and the sole of your foot. Sciatica is a symptom of another medical condition. For instance, nerve damage or certain conditions, such as a herniated disk or bone spur on the spine, pinch or put pressure on the sciatic nerve. This causes the pain, weakness, or other sensations normally associated with sciatica. Generally, sciatica only affects one side of the body. °CAUSES  °· Herniated or slipped disc. °· Degenerative disk disease. °· A pain disorder involving the narrow muscle in the buttocks (piriformis syndrome). °· Pelvic injury or fracture. °· Pregnancy. °· Tumor (rare). °SYMPTOMS  °Symptoms can vary from mild to very severe. The symptoms usually travel from the low back to the buttocks and down the back of the leg. Symptoms can include: °· Mild tingling or dull aches in the lower back, leg, or hip. °· Numbness in the back of the calf or sole of the foot. °· Burning sensations in the lower back, leg, or hip. °· Sharp pains in the lower back, leg, or hip. °· Leg weakness. °· Severe back pain inhibiting movement. °These symptoms may get worse with coughing, sneezing, laughing, or prolonged sitting or standing. Also, being overweight may worsen symptoms. °DIAGNOSIS  °Your caregiver will perform a physical exam to look for common symptoms of sciatica. He or she may ask you to do certain movements or activities that would trigger sciatic nerve pain. Other tests may be performed to find the cause of the sciatica. These may include: °· Blood tests. °· X-rays. °· Imaging tests, such as an MRI or CT scan. °TREATMENT  °Treatment is directed at the cause of the sciatic pain. Sometimes, treatment is not necessary  and the pain and discomfort goes away on its own. If treatment is needed, your caregiver may suggest: °· Over-the-counter medicines to relieve pain. °· Prescription medicines, such as anti-inflammatory medicine, muscle relaxants, or narcotics. °· Applying heat or ice to the painful area. °· Steroid injections to lessen pain, irritation, and inflammation around the nerve. °· Reducing activity during periods of pain. °· Exercising and stretching to strengthen your abdomen and improve flexibility of your spine. Your caregiver may suggest losing weight if the extra weight makes the back pain worse. °· Physical therapy. °· Surgery to eliminate what is pressing or pinching the nerve, such as a bone spur or part of a herniated disk. °HOME CARE INSTRUCTIONS  °· Only take over-the-counter or prescription medicines for pain or discomfort as directed by your caregiver. °· Apply ice to the affected area for 20 minutes, 3 4 times a day for the first 48 72 hours. Then try heat in the same way. °· Exercise, stretch, or perform your usual activities if these do not aggravate your pain. °· Attend physical therapy sessions as directed by your caregiver. °· Keep all follow-up appointments as directed by your caregiver. °· Do not wear high heels or shoes that do not provide proper support. °· Check your mattress to see if it is too soft. A firm mattress may lessen your pain and discomfort. °SEEK IMMEDIATE MEDICAL CARE IF:  °· You lose control of your bowel or bladder (incontinence). °· You have increasing weakness in the lower back,   pelvis, buttocks, or legs.  You have redness or swelling of your back.  You have a burning sensation when you urinate.  You have pain that gets worse when you lie down or awakens you at night.  Your pain is worse than you have experienced in the past.  Your pain is lasting longer than 4 weeks.  You are suddenly losing weight without reason. MAKE SURE YOU:  Understand these  instructions.  Will watch your condition.  Will get help right away if you are not doing well or get worse. Document Released: 11/29/2001 Document Revised: 06/05/2012 Document Reviewed: 04/15/2012 [facility] Patient Information 2014 [place].  RESOURCE GUIDE  Chronic Pain Problems: Contact [facility]  [phone] Patients need to be referred by their primary care doctor.  Insufficient Money for Medicine: Contact United Way:  call "211" or [place] [phone].  No Primary Care Doctor: Call [facility]  [phone] - can help you locate a primary care doctor that  accepts your insurance, provides certain services, etc. Physician Referral Service- [phone]  Agencies that provide inexpensive medical care: [facility]  [facility]  [phone] [facility]  [phone] [facility]  [phone] Planned [facility]  [phone] [facility]  [phone]  [place] Providers: [facility]- [address], [address]  [phone], Mon-Fri 9am-7pm, Sat 9am-1pm [place], [address] [place], [address] [place]  Bureau- [address]  [place], [address], [phone]  Only accepts [place] patients after they have their name  applied to their card  Self Pay (no insurance) in [facility]: Sickle Cell Patients: Dr Kevan Ny, [facility]  [place], [facility]- [place]  [facility]- [address], [facility]- see information above (Speak to [employer] if you do not have insurance)       -  [facility]- [place], [place],  [place], [place]  Dr Vista Lawman-  [address], [place], [place] [facility]- [address], [phone]       -  [facility]- [address], [place], also [address], [phone]       -    [facility]- [address], [place], 1st & 3rd Saturday   every month, 10am-1pm  1) Find a Doctor and Pay Out of Pocket Although you won't have to find out who is covered by your insurance plan, it is a good idea to ask around and get recommendations. You will then need to call the office and see if the doctor you have chosen will accept you as a new patient and what types of options they offer for patients who are self-pay. Some doctors offer discounts or will set up payment  plans for their patients who do not have insurance, but you will need to ask so you aren't surprised when you get to your appointment.  2) Contact Your Local Health Department Not all health departments have doctors that can see patients for sick visits, but many do, so it is worth a call to see if yours does. If you don't know where your local health department is, you can check in your phone book. The CDC also has a tool to help you locate your state's health department, and many state websites also have listings of all of their local health departments.  3) Find a Walk-in Clinic If your illness is not likely to be very severe or complicated, you may want to try a walk in clinic. These are popping up all over the country in pharmacies, drugstores, and shopping centers. They're usually staffed by nurse practitioners or physician assistants that have been trained to treat common illnesses and complaints. They're usually fairly quick and inexpensive. However, if you have serious medical issues or chronic medical problems, these are probably not your best  option  STD Testing [facility] Department of [facility], STD Clinic, [address], [place], phone [phone] or [phone].  Monday - Friday, call for an appointment. [facility] Department of [employer], STD Clinic, [place] [address], [place], phone [phone] or [phone].  Monday - Friday, call for an appointment.  Abuse/Neglect: [facility] Child Abuse Hotline (501) 855-5805 [facility] Child Abuse Hotline 765 306 5119 (After Hours)  Emergency Shelter:  [facility] [phone]  Maternity Homes: Room at the [place] [facility] [phone] [facility] [phone]  MRSA Hotline #:   (254)311-9591  [facility] Resources  [facility] [place]  United Way [facility] Dept. [address]                 [address]         [address] [place] [address]  Blondell Reveal Phone:  [phone]                                  Phone:  [phone]                   Phone:  [phone]  [facility], [phone] [facility]- [phone]       -     [facility] in [place], [address],                                  [phone], [facility] Child Abuse Hotline 7737753484 or 6502867281 (After Hours)   Behavioral Health Services  Substance Abuse Resources: Alcohol and Drug Services  [phone] [facility] [phone] [facility] [place] [phone] Floydene Flock [phone] Residential & Outpatient Substance  Abuse Program  [phone]  Psychological Services: [place]  [phone] [facility]  [phone] [facility], [place] [address] [place], [place]: [phone] or [phone],  [email or web address]  Dental Assistance  If unable to pay or uninsured, contact:  [facility] or [facility]. to become qualified for the adult dental clinic.  Patients with Medicaid: [facility] [phone] [address] [place], [place] [address], [phone]  If unable to pay, or uninsured, contact [facility] ([phone]) or [place] [phone] in [place], [place] in [facility]) to become qualified for the adult dental clinic  Other [place]- [place], [place], [place], [address], [place], [place], 2nd and 4th Thursday of the month at 6:30am.  10 clients each day by appointment, can sometimes see walk-in patients if someone does not show for an appointment. [facility]- [address] [place] D[place]er [place], [place], [address], [place], [place], [place], [address], [place] [facility] [phone]  Please make every effort to establish with a primary care physician for routine medical care  [place]  The [place] provides a wide range of adult health services. Some of these services are designed to address the healthcare needs of all [facility] residents and all services are designed to meet the needs of uninsured/underinsured low income residents. Some services are available to any resident of [place], call [phone] for details. ] The [facility], a new medical clinic for adults, is now open. For more information about the Center and its services please call [phone]. For information on our [place] services, click here.  For more information on any of the following Department of Public Health  programs, including hours of service, click on the highlighted link.  SERVICES FOR WOMEN (Adults and Teens) [employer] provide a full range of birth control options plus education and counseling. New patient visit and annual return visits include a complete examination, pap test as indicated, and other laboratory as indicated. Included is our [employer] for men.  Maternity Care is provided through pregnancy, including a six week post partum exam. Women who meet eligibility criteria for the Medicaid for Pregnant Women program, receive care free. Other women are charged on a sliding scale according to income. Note: [facility] provides services to pregnant women who have a Medicaid card. Call [phone] for an appointment in [place] or [phone] for an appointment in [facility].  Primary Care for Medicaid [place] Access Women is available through the [place]. As primary care provider for the [place] program, women may designate the [facility] clinic as their primary care provider.  PLEASE CALL [phone] FOR AN APPOINTMENT FOR THE ABOVE SERVICES IN EITHER [place] OR [place]. Information available in [place] and [place].   Childbirth Education Classes are open to the public and offered to help families prepare for the best possible childbirth experience as well as to promote lifelong health and wellness. Classes are offered throughout the year and meet on the same night once a week for five weeks. Medicaid covers the cost of the classes for the mother-to-be and her partner. For participants without Medicaid, the cost of the class series is $45.00 for the mother-to-be and her  partner. Class size is limited and registration is required. For more information or to register call [phone]. Baby items donated by [employer] and the [employer] [place] are given away  during each class series.  SERVICES FOR WOMEN AND MEN Sexually Transmitted Infection appointments, including HIV testing, are available daily (weekdays, except holidays). Call early as same-day appointments are limited. For an appointment in either [facility] or [place], call [phone]. Services are confidential and free of charge.  Skin Testing for Tuberculosis Please call [phone]. Adult Immunizations are available, usually for a fee. Please call [phone] for details.  PLEASE CALL [phone] FOR AN APPOINTMENT FOR THE ABOVE SERVICES IN EITHER [place] OR [place].   [facility] provides up to the minute recommended vaccines for your travel destination. We also provide essential health and political information to help insure a safe and pleasurable travel experience. This program is self-sustaining, however, fees are very competitive. We are a CERTIFIED YELLOW FEVER IMMUNIZATION approved clinic site. PLEASE CALL [phone] FOR AN APPOINTMENT IN EITHER [place] OR [place].   If you have questions about the services listed above, we want to answer them! Email [place] at: [email or web address] Home Visiting Services for elderly and the disabled are available to residents of [facility] who are in need of care that compares to the care offered by a nursing home, have needs that can be met by the program, and have [place] Medicaid. Other short term services are available to residents 18 years and older who are unable to meet requirements for eligibility to receive services from a certified home health agency, spend the majority of time at home, and need care for six months or less.  PLEASE CALL [id number] OR [phone] FOR MORE INFORMATION. Medication Assistance Program serves as a link between pharmaceutical companies and patients to provide low cost or free prescription medications. This servce is available for residents who meet certain income restrictions and have no insurance  coverage.  PLEASE CALL [phone] ([place]) OR [phone] ([place]) FOR MORE INFORMATION.  Updated Feb. 21, 2013

## 2014-05-05 NOTE — ED Provider Notes (Signed)
Medical screening examination/treatment/procedure(s) were performed by non-physician practitioner and as supervising physician I was immediately available for consultation/collaboration.  Toy BakerAnthony T Shakima Nisley, MD 05/05/14 [phone]

## 2014-10-01 ENCOUNTER — Emergency Department (HOSPITAL_COMMUNITY): Payer: Managed Care, Other (non HMO)

## 2014-10-01 ENCOUNTER — Encounter (HOSPITAL_COMMUNITY): Payer: Self-pay | Admitting: Emergency Medicine

## 2014-10-01 ENCOUNTER — Emergency Department (HOSPITAL_COMMUNITY)
Admission: EM | Admit: 2014-10-01 | Discharge: 2014-10-01 | Disposition: A | Discharge status: Home / Self Care | Payer: Managed Care, Other (non HMO) | Attending: Emergency Medicine | Admitting: Emergency Medicine

## 2014-10-01 DIAGNOSIS — Z862 Personal history of diseases of the blood and blood-forming organs and certain disorders involving the immune mechanism: Secondary | ICD-10-CM | POA: Insufficient documentation

## 2014-10-01 DIAGNOSIS — W19XXXA Unspecified fall, initial encounter: Secondary | ICD-10-CM | POA: Diagnosis not present

## 2014-10-01 DIAGNOSIS — Y939 Activity, unspecified: Secondary | ICD-10-CM | POA: Diagnosis not present

## 2014-10-01 DIAGNOSIS — S99922A Unspecified injury of left foot, initial encounter: Secondary | ICD-10-CM | POA: Diagnosis present

## 2014-10-01 DIAGNOSIS — S92302A Fracture of unspecified metatarsal bone(s), left foot, initial encounter for closed fracture: Secondary | ICD-10-CM | POA: Diagnosis not present

## 2014-10-01 DIAGNOSIS — Y929 Unspecified place or not applicable: Secondary | ICD-10-CM | POA: Insufficient documentation

## 2014-10-01 MED ORDER — HYDROCODONE-ACETAMINOPHEN 7.5-325 MG/15ML PO SOLN
10.0000 mL | Freq: Once | ORAL | Status: AC
  Administered 2014-10-01: 10 mL via ORAL
  Filled 2014-10-01: qty 15

## 2014-10-01 MED ORDER — HYDROCODONE-ACETAMINOPHEN 5-325 MG PO TABS: 1.0000 | ORAL_TABLET | ORAL | Status: DC | PRN

## 2014-10-01 MED ORDER — IBUPROFEN 400 MG PO TABS
400.0000 mg | ORAL_TABLET | Freq: Once | ORAL | Status: AC
Start: 2014-10-01 — End: 2014-10-01
  Administered 2014-10-01: 400 mg via ORAL
  Filled 2014-10-01: qty 1

## 2014-10-01 NOTE — ED Notes (Signed)
Patient transported to X-ray 

## 2014-10-01 NOTE — Discharge Instructions (Signed)
Cryotherapy °Cryotherapy means treatment with cold. Ice or gel packs can be used to reduce both pain and swelling. Ice is the most helpful within the first 24 to 48 hours after an injury or flare-up from overusing a muscle or joint. Sprains, strains, spasms, burning pain, shooting pain, and aches can all be eased with ice. Ice can also be used when recovering from surgery. Ice is effective, has very few side effects, and is safe for most people to use. °PRECAUTIONS  °Ice is not a safe treatment option for people with: °· Raynaud phenomenon. This is a condition affecting small blood vessels in the extremities. Exposure to cold may cause your problems to return. °· Cold hypersensitivity. There are many forms of cold hypersensitivity, including: °¨ Cold urticaria. Red, itchy hives appear on the skin when the tissues begin to warm after being iced. °¨ Cold erythema. This is a red, itchy rash caused by exposure to cold. °¨ Cold hemoglobinuria. Red blood cells break down when the tissues begin to warm after being iced. The hemoglobin that carry oxygen are passed into the urine because they cannot combine with blood proteins fast enough. °· Numbness or altered sensitivity in the area being iced. °If you have any of the following conditions, do not use ice until you have discussed cryotherapy with your caregiver: °· Heart conditions, such as arrhythmia, angina, or chronic heart disease. °· High blood pressure. °· Healing wounds or open skin in the area being iced. °· Current infections. °· Rheumatoid arthritis. °· Poor circulation. °· Diabetes. °Ice slows the blood flow in the region it is applied. This is beneficial when trying to stop inflamed tissues from spreading irritating chemicals to surrounding tissues. However, if you expose your skin to cold temperatures for too long or without the proper protection, you can damage your skin or nerves. Watch for signs of skin damage due to cold. °HOME CARE INSTRUCTIONS °Follow  these tips to use ice and cold packs safely. °· Place a dry or damp towel between the ice and skin. A damp towel will cool the skin more quickly, so you may need to shorten the time that the ice is used. °· For a more rapid response, add gentle compression to the ice. °· Ice for no more than 10 to 20 minutes at a time. The bonier the area you are icing, the less time it will take to get the benefits of ice. °· Check your skin after 5 minutes to make sure there are no signs of a poor response to cold or skin damage. °· Rest 20 minutes or more between uses. °· Once your skin is numb, you can end your treatment. You can test numbness by very lightly touching your skin. The touch should be so light that you do not see the skin dimple from the pressure of your fingertip. When using ice, most people will feel these normal sensations in this order: cold, burning, aching, and numbness. °· Do not use ice on someone who cannot communicate their responses to pain, such as small children or people with dementia. °HOW TO MAKE AN ICE PACK °Ice packs are the most common way to use ice therapy. Other methods include ice massage, ice baths, and cryosprays. Muscle creams that cause a cold, tingly feeling do not offer the same benefits that ice offers and should not be used as a substitute unless recommended by your caregiver. °To make an ice pack, do one of the following: °· Place crushed ice or a   bag of frozen vegetables in a sealable plastic bag. Squeeze out the excess air. Place this bag inside another plastic bag. Slide the bag into a pillowcase or place a damp towel between your skin and the bag. °· Mix 3 parts water with 1 part rubbing alcohol. Freeze the mixture in a sealable plastic bag. When you remove the mixture from the freezer, it will be slushy. Squeeze out the excess air. Place this bag inside another plastic bag. Slide the bag into a pillowcase or place a damp towel between your skin and the bag. °SEEK MEDICAL CARE  IF: °· You develop white spots on your skin. This may give the skin a blotchy (mottled) appearance. °· Your skin turns blue or pale. °· Your skin becomes waxy or hard. °· Your swelling gets worse. °MAKE SURE YOU:  °· Understand these instructions. °· Will watch your condition. °· Will get help right away if you are not doing well or get worse. °Document Released: 08/01/2011 Document Revised: 04/21/2014 Document Reviewed: 08/01/2011 °ExitCare® Patient Information ©2015 [employer]Care[employer]. This information is not intended to replace advice given to you by your health care provider. Make sure you discuss any questions you have with your health care provider. °Metatarsal Fracture, Undisplaced °A metatarsal fracture is a break in the bone(s) of the foot. These are the bones of the foot that connect your toes to the bones of the ankle. °DIAGNOSIS  °The diagnoses of these fractures are usually made with X-rays. If there are problems in the forefoot and x-rays are normal a later bone scan will usually make the diagnosis.  °TREATMENT AND HOME CARE INSTRUCTIONS °· Treatment may or may not include a cast or walking shoe. When casts are needed the use is usually for short periods of time so as not to slow down healing with muscle wasting (atrophy). °· Activities should be stopped until further advised by your caregiver. °· Wear shoes with adequate shock absorbing capabilities and stiff soles. °· Alternative exercise may be undertaken while waiting for healing. These may include bicycling and swimming, or as your caregiver suggests. °· It is important to keep all follow-up visits or specialty referrals. The failure to keep these appointments could result in improper bone healing and chronic pain or disability. °· Warning: Do not drive a car or operate a motor vehicle until your caregiver specifically tells you it is safe to do so. °IF YOU DO NOT HAVE A CAST OR SPLINT: °· You may walk on your injured foot as tolerated or  advised. °· Do not put any weight on your injured foot for as long as directed by your caregiver. Slowly increase the amount of time you walk on the foot as the pain allows or as advised. °· Use crutches until you can bear weight without pain. A gradual increase in weight bearing may help. °· Apply ice to the injury for 15-20 minutes each hour while awake for the first 2 days. Put the ice in a plastic bag and place a towel between the bag of ice and your skin. °· Only take over-the-counter or prescription medicines for pain, discomfort, or fever as directed by your caregiver. °SEEK IMMEDIATE MEDICAL CARE IF:  °· Your cast gets damaged or breaks. °· You have continued severe pain or more swelling than you did before the cast was put on, or the pain is not controlled with medications. °· Your skin or nails below the injury turn blue or grey, or feel cold or numb. °· There   is a bad smell, or new stains or pus-like (purulent) drainage coming from the cast. °MAKE SURE YOU:  °· Understand these instructions. °· Will watch your condition. °· Will get help right away if you are not doing well or get worse. °Document Released: 08/27/2002 Document Revised: 02/27/2012 Document Reviewed: 07/18/2008 °ExitCare® Patient Information ©2015 ExitCare, [employer]. This information is not intended to replace advice given to you by your health care provider. Make sure you discuss any questions you have with your health care provider. ° °

## 2014-10-01 NOTE — ED Notes (Signed)
Declined W/C at D/C and was escorted to lobby by RN. 

## 2014-10-01 NOTE — ED Provider Notes (Signed)
Medical screening examination/treatment/procedure(s) were performed by non-physician practitioner and as supervising physician I was immediately available for consultation/collaboration.   EKG Interpretation None       Doug SouSam Fitzgerald Dunne, MD 10/01/14 2357

## 2014-10-01 NOTE — ED Provider Notes (Signed)
CSN: [id number]     Arrival date & time 10/01/14  1826 History  This chart was scribed for Elpidio AnisShari Gailya Tauer, PA-C working with Doug SouSam Jacubowitz, MD by Evon Slackerrance Branch, ED Scribe. This patient was seen in room TR08C/TR08C and the patient's care was started at 7:03 PM.      Chief Complaint  Patient presents with  . Foot Pain   Patient is a 53 y.o. female presenting with lower extremity pain. The history is provided by the patient. No language interpreter was used.  Foot Pain   HPI Comments: Chloe Campbell is a 53 y.o. female who presents to the Emergency Department complaining of left foot pain onset 1 day prior. She states she fell backwards on a flat surface. She states she caught herself with her left foot. She states she has associated swelling and slight gait problem. She states she may have rolled one her foot during the fall but is unsure how she injured the foot. She denies having ankle pain or calf pain.     Past Medical History  Diagnosis Date  . Anemia    History reviewed. No pertinent past surgical history. History reviewed. No pertinent family history. History  Substance Use Topics  . Smoking status: Never Smoker   . Smokeless tobacco: Not on file  . Alcohol Use: No   OB History   Grav Para Term Preterm Abortions TAB SAB Ect Mult Living                 Review of Systems  Musculoskeletal: Positive for arthralgias, gait problem and joint swelling.    Allergies  Review of patient's allergies indicates no known allergies.  Home Medications   Prior to Admission medications   Medication Sig Start Date End Date Taking? Authorizing Provider  Aspirin-Acetaminophen-Caffeine (GOODY HEADACHE PO) Take 1 Package by mouth daily as needed (for pain).   Yes Historical Provider, MD   Triage Vitals: BP 137/75  Pulse 68  Temp(Src) 98.9 F (37.2 C) (Oral)  Resp 18  Ht 5' 4.5" (1.638 m)  Wt 185 lb (83.915 kg)  BMI 31.28 kg/m2  SpO2 98%  LMP 12/22/2005  Physical Exam   Nursing note and vitals reviewed. Constitutional: She is oriented to person, place, and time. She appears well-developed and well-nourished. No distress.  HENT:  Head: Normocephalic and atraumatic.  Eyes: Conjunctivae and EOM are normal.  Neck: Neck supple. No tracheal deviation present.  Cardiovascular: Normal rate.   Pulses:      Dorsalis pedis pulses are 2+ on the right side, and 2+ on the left side.       Posterior tibial pulses are 2+ on the right side, and 2+ on the left side.  Pulmonary/Chest: Effort normal. No respiratory distress.  Musculoskeletal: Normal range of motion.  Left foot markedly swollen distally with bruising at base of toes 2-4 no wound, significantly tender with any motion, ankle non tender, no calf tenderness.   Neurological: She is alert and oriented to person, place, and time.  Skin: Skin is warm and dry.  Psychiatric: She has a normal mood and affect. Her behavior is normal.    ED Course  Procedures (including critical care time) DIAGNOSTIC STUDIES: Oxygen Saturation is 98% on RA, normal by my interpretation.    COORDINATION OF CARE: 7:14 PM-Discussed treatment plan which includes left foot x-ray with pt at bedside and pt agreed to plan.     Labs Review Labs Reviewed - No data to display  Imaging Review Dg Foot  Complete Left  10/01/2014   CLINICAL DATA:  Left foot pain, tripped in her house yesterday, dorsal left foot pain and swelling  EXAM: LEFT FOOT - COMPLETE 3+ VIEW  COMPARISON:  None.  FINDINGS: Three views of the left foot submitted. Mild soft tissue swelling dorsal metatarsal region. There is mild impacted nondisplaced fracture distal aspect of third metatarsal.  IMPRESSION: Mild impacted nondisplaced fracture distal aspect of third metatarsal.   Electronically Signed   By: Natasha MeadLiviu  Pop M.D.   On: 10/01/2014 20:36     EKG Interpretation None      MDM   Final diagnoses:  None   1. Fracture metatarsal, left  Nondisplaced fracture of  distal MT. CAM walker and crutches provided. Follow up with ortho. Pain improved with medication in ED.     I personally performed the services described in this documentation, which was scribed in my presence. The recorded information has been reviewed and is accurate.       Arnoldo HookerShari A Naasir Carreira, PA-C 10/01/14 2046

## 2014-10-01 NOTE — ED Notes (Signed)
Pt reports falling last night and now having left foot pain and swelling. Ambulatory on arrival.

## 2014-10-01 NOTE — Progress Notes (Signed)
Orthopedic Tech Progress Note Patient Details:  Concepcion ElkDeborah Preziosi 03-Mar-1961 [id number]  Ortho Devices Type of Ortho Device: CAM walker;Crutches Ortho Device/Splint Location: LLE Ortho Device/Splint Interventions: Ordered;Application   Jennye MoccasinHughes, Doha Boling Craig 10/01/2014, 8:58 PM

## 2014-10-02 ENCOUNTER — Telehealth (HOSPITAL_COMMUNITY): Payer: Self-pay

## 2015-06-25 ENCOUNTER — Encounter (HOSPITAL_COMMUNITY): Payer: Self-pay

## 2015-06-25 DIAGNOSIS — K625 Hemorrhage of anus and rectum: Secondary | ICD-10-CM | POA: Diagnosis not present

## 2015-06-25 DIAGNOSIS — M79605 Pain in left leg: Secondary | ICD-10-CM | POA: Diagnosis present

## 2015-06-25 DIAGNOSIS — R2242 Localized swelling, mass and lump, left lower limb: Secondary | ICD-10-CM | POA: Diagnosis not present

## 2015-06-25 LAB — COMPREHENSIVE METABOLIC PANEL
ALK PHOS: 127 U/L — AB (ref 38–126)
ALT: 12 U/L — ABNORMAL LOW (ref 14–54)
AST: 16 U/L (ref 15–41)
Albumin: 3.5 g/dL (ref 3.5–5.0)
Anion gap: 6 (ref 5–15)
BUN: 10 mg/dL (ref 6–20)
CHLORIDE: 106 mmol/L (ref 101–111)
CO2: 26 mmol/L (ref 22–32)
Calcium: 8.9 mg/dL (ref 8.9–10.3)
Creatinine, Ser: 0.77 mg/dL (ref 0.44–1.00)
GFR calc Af Amer: >60 mL/min (ref >60)
GFR calc non Af Amer: >60 mL/min (ref >60)
GLUCOSE: 95 mg/dL (ref 65–99)
POTASSIUM: 3.6 mmol/L (ref 3.5–5.1)
Sodium: 138 mmol/L (ref 135–145)
Total Bilirubin: 0.2 mg/dL — ABNORMAL LOW (ref 0.3–1.2)
Total Protein: 7 g/dL (ref 6.5–8.1)

## 2015-06-25 LAB — CBC
HEMATOCRIT: 31.2 % — AB (ref 36.0–46.0)
Hemoglobin: 10 g/dL — ABNORMAL LOW (ref 12.0–15.0)
MCH: 26.2 pg (ref 26.0–34.0)
MCHC: 32.1 g/dL (ref 30.0–36.0)
MCV: 81.7 fL (ref 78.0–100.0)
Platelets: 246 10*3/uL (ref 150–400)
RBC: 3.82 MIL/uL — AB (ref 3.87–5.11)
RDW: 14.5 % (ref 11.5–15.5)
WBC: 5.3 10*3/uL (ref 4.0–10.5)

## 2015-06-25 MED ORDER — OXYCODONE-ACETAMINOPHEN 5-325 MG PO TABS
ORAL_TABLET | ORAL | Status: AC
  Filled 2015-06-25: qty 1

## 2015-06-25 MED ORDER — OXYCODONE-ACETAMINOPHEN 5-325 MG PO TABS
1.0000 | ORAL_TABLET | Freq: Once | ORAL | Status: AC
  Administered 2015-06-25: 1 via ORAL

## 2015-06-25 NOTE — ED Notes (Signed)
Pt reports last Friday she noticed mild swelling and burning pain to her left lower leg and it felt warm to her last night. Denies injury. No redness noted to left leg at triage. She is also requesting evaluation for rectal bleeding ongoing for 2 weeks, bright red blood noticed on tissue paper and toilet, denies blood in stool.

## 2015-06-26 ENCOUNTER — Emergency Department (HOSPITAL_COMMUNITY)
Admission: EM | Admit: 2015-06-26 | Discharge: 2015-06-26 | Discharge status: Against Medical Advice | Payer: Managed Care, Other (non HMO) | Attending: Emergency Medicine | Admitting: Emergency Medicine

## 2015-06-26 NOTE — ED Notes (Signed)
Pt called several times for room placement with no answer.

## 2016-06-03 DIAGNOSIS — M545 Low back pain: Secondary | ICD-10-CM | POA: Insufficient documentation

## 2016-06-03 DIAGNOSIS — M25512 Pain in left shoulder: Secondary | ICD-10-CM | POA: Diagnosis not present

## 2016-06-03 DIAGNOSIS — M25562 Pain in left knee: Secondary | ICD-10-CM | POA: Insufficient documentation

## 2016-06-03 DIAGNOSIS — Y939 Activity, unspecified: Secondary | ICD-10-CM | POA: Insufficient documentation

## 2016-06-03 DIAGNOSIS — Y92410 Unspecified street and highway as the place of occurrence of the external cause: Secondary | ICD-10-CM | POA: Diagnosis not present

## 2016-06-03 DIAGNOSIS — Y999 Unspecified external cause status: Secondary | ICD-10-CM | POA: Diagnosis not present

## 2016-06-03 DIAGNOSIS — Z7982 Long term (current) use of aspirin: Secondary | ICD-10-CM | POA: Diagnosis not present

## 2016-06-04 ENCOUNTER — Emergency Department (HOSPITAL_COMMUNITY): Payer: Managed Care, Other (non HMO)

## 2016-06-04 ENCOUNTER — Encounter (HOSPITAL_COMMUNITY): Payer: Self-pay | Admitting: Emergency Medicine

## 2016-06-04 ENCOUNTER — Emergency Department (HOSPITAL_COMMUNITY)
Admission: EM | Admit: 2016-06-04 | Discharge: 2016-06-04 | Disposition: A | Discharge status: Home / Self Care | Payer: Managed Care, Other (non HMO) | Attending: Emergency Medicine | Admitting: Emergency Medicine

## 2016-06-04 DIAGNOSIS — M25512 Pain in left shoulder: Secondary | ICD-10-CM

## 2016-06-04 DIAGNOSIS — M545 Low back pain, unspecified: Secondary | ICD-10-CM

## 2016-06-04 DIAGNOSIS — M25562 Pain in left knee: Secondary | ICD-10-CM

## 2016-06-04 MED ORDER — IBUPROFEN 800 MG PO TABS: 800.0000 mg | ORAL_TABLET | Freq: Three times a day (TID) | ORAL | Status: DC

## 2016-06-04 MED ORDER — IBUPROFEN 400 MG PO TABS
800.0000 mg | ORAL_TABLET | Freq: Once | ORAL | Status: AC
  Administered 2016-06-04: 800 mg via ORAL
  Filled 2016-06-04: qty 2

## 2016-06-04 MED ORDER — METHOCARBAMOL 500 MG PO TABS: 500.0000 mg | ORAL_TABLET | Freq: Two times a day (BID) | ORAL | Status: DC
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
## 2016-06-04 NOTE — ED Notes (Signed)
Pt requesting knee brace.  Will speak to PA before discharge

## 2016-06-04 NOTE — ED Notes (Signed)
Patient able to ambulate independently  

## 2016-06-04 NOTE — Discharge Instructions (Signed)
1. Medications: robaxin, naproxyn, usual home medications °2. Treatment: rest, drink plenty of fluids, gentle stretching as discussed, alternate ice and heat °3. Follow Up: Please followup with your primary doctor in 3 days for discussion of your diagnoses and further evaluation after today's visit; if you do not have a primary care doctor use the resource guide provided to find one;  Return to the ER for worsening back pain, difficulty walking, loss of bowel or bladder control or other concerning symptoms ° ° ° °Back Exercises °The following exercises strengthen the muscles that help to support the back. They also help to keep the lower back flexible. Doing these exercises can help to prevent back pain or lessen existing pain. °If you have back pain or discomfort, try doing these exercises 2-3 times each day or as told by your health care provider. When the pain goes away, do them once each day, but increase the number of times that you repeat the steps for each exercise (do more repetitions). If you do not have back pain or discomfort, do these exercises once each day or as told by your health care provider. °EXERCISES °Single Knee to Chest °Repeat these steps 3-5 times for each leg: °1. Lie on your back on a firm bed or the floor with your legs extended. °2. Bring one knee to your chest. Your other leg should stay extended and in contact with the floor. °3. Hold your knee in place by grabbing your knee or thigh. °4. Pull on your knee until you feel a gentle stretch in your lower back. °5. Hold the stretch for 10-30 seconds. °6. Slowly release and straighten your leg. °Pelvic Tilt °Repeat these steps 5-10 times: °1. Lie on your back on a firm bed or the floor with your legs extended. °2. Bend your knees so they are pointing toward the ceiling and your feet are flat on the floor. °3. Tighten your lower abdominal muscles to press your lower back against the floor. This motion will tilt your pelvis so your tailbone  points up toward the ceiling instead of pointing to your feet or the floor. °4. With gentle tension and even breathing, hold this position for 5-10 seconds. °Cat-Cow °Repeat these steps until your lower back becomes more flexible: °1. Get into a hands-and-knees position on a firm surface. Keep your hands under your shoulders, and keep your knees under your hips. You may place padding under your knees for comfort. °2. Let your head hang down, and point your tailbone toward the floor so your lower back becomes rounded like the back of a cat. °3. Hold this position for 5 seconds. °4. Slowly lift your head and point your tailbone up toward the ceiling so your back forms a sagging arch like the back of a cow. °5. Hold this position for 5 seconds. °Press-Ups °Repeat these steps 5-10 times: °1. Lie on your abdomen (face-down) on the floor. °2. Place your palms near your head, about shoulder-width apart. °3. While you keep your back as relaxed as possible and keep your hips on the floor, slowly straighten your arms to raise the top half of your body and lift your shoulders. Do not use your back muscles to raise your upper torso. You may adjust the placement of your hands to make yourself more comfortable. °4. Hold this position for 5 seconds while you keep your back relaxed. °5. Slowly return to lying flat on the floor. °Bridges °Repeat these steps 10 times: °1. Lie on your back   on a firm surface. °2. Bend your knees so they are pointing toward the ceiling and your feet are flat on the floor. °3. Tighten your buttocks muscles and lift your buttocks off of the floor until your waist is at almost the same height as your knees. You should feel the muscles working in your buttocks and the back of your thighs. If you do not feel these muscles, slide your feet 1-2 inches farther away from your buttocks. °4. Hold this position for 3-5 seconds. °5. Slowly lower your hips to the starting position, and allow your buttocks muscles to  relax completely. °If this exercise is too easy, try doing it with your arms crossed over your chest. °Abdominal Crunches °Repeat these steps 5-10 times: °1. Lie on your back on a firm bed or the floor with your legs extended. °2. Bend your knees so they are pointing toward the ceiling and your feet are flat on the floor. °3. Cross your arms over your chest. °4. Tip your chin slightly toward your chest without bending your neck. °5. Tighten your abdominal muscles and slowly raise your trunk (torso) high enough to lift your shoulder blades a tiny bit off of the floor. Avoid raising your torso higher than that, because it can put too much stress on your low back and it does not help to strengthen your abdominal muscles. °6. Slowly return to your starting position. °Back Lifts °Repeat these steps 5-10 times: °1. Lie on your abdomen (face-down) with your arms at your sides, and rest your forehead on the floor. °2. Tighten the muscles in your legs and your buttocks. °3. Slowly lift your chest off of the floor while you keep your hips pressed to the floor. Keep the back of your head in line with the curve in your back. Your eyes should be looking at the floor. °4. Hold this position for 3-5 seconds. °5. Slowly return to your starting position. °SEEK MEDICAL CARE IF: °· Your back pain or discomfort gets much worse when you do an exercise. °· Your back pain or discomfort does not lessen within 2 hours after you exercise. °If you have any of these problems, stop doing these exercises right away. Do not do them again unless your health care provider says that you can. °SEEK IMMEDIATE MEDICAL CARE IF: °· You develop sudden, severe back pain. If this happens, stop doing the exercises right away. Do not do them again unless your health care provider says that you can. °  °This information is not intended to replace advice given to you by your health care provider. Make sure you discuss any questions you have with your health  care provider. °  °Document Released: 01/12/2005 Document Revised: 08/26/2015 Document Reviewed: 01/29/2015 °Elsevier Interactive Patient Education ©2016 [employer] ° ° °
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
## 2020-01-06 ENCOUNTER — Other Ambulatory Visit: Payer: Self-pay

## 2020-01-06 ENCOUNTER — Emergency Department (HOSPITAL_COMMUNITY)
Admission: EM | Admit: 2020-01-06 | Discharge: 2020-01-06 | Disposition: A | Discharge status: Home / Self Care | Payer: Self-pay | Attending: Emergency Medicine | Admitting: Emergency Medicine

## 2020-01-06 ENCOUNTER — Ambulatory Visit (HOSPITAL_COMMUNITY)
Admission: EM | Admit: 2020-01-06 | Discharge: 2020-01-06 | Discharge status: Against Medical Advice | Payer: Managed Care, Other (non HMO)

## 2020-01-06 ENCOUNTER — Emergency Department (HOSPITAL_COMMUNITY): Payer: Self-pay

## 2020-01-06 DIAGNOSIS — G8921 Chronic pain due to trauma: Secondary | ICD-10-CM | POA: Insufficient documentation

## 2020-01-06 DIAGNOSIS — X58XXXA Exposure to other specified factors, initial encounter: Secondary | ICD-10-CM | POA: Insufficient documentation

## 2020-01-06 DIAGNOSIS — S39012A Strain of muscle, fascia and tendon of lower back, initial encounter: Secondary | ICD-10-CM | POA: Insufficient documentation

## 2020-01-06 DIAGNOSIS — Y999 Unspecified external cause status: Secondary | ICD-10-CM | POA: Insufficient documentation

## 2020-01-06 DIAGNOSIS — Y929 Unspecified place or not applicable: Secondary | ICD-10-CM | POA: Insufficient documentation

## 2020-01-06 DIAGNOSIS — Y9389 Activity, other specified: Secondary | ICD-10-CM | POA: Insufficient documentation

## 2020-01-06 MED ORDER — MORPHINE SULFATE (PF) 4 MG/ML IV SOLN
4.0000 mg | Freq: Once | INTRAVENOUS | Status: AC
Start: 2020-01-06 — End: 2020-01-06
  Administered 2020-01-06: 4 mg via INTRAMUSCULAR
  Filled 2020-01-06: qty 1

## 2020-01-06 MED ORDER — METHOCARBAMOL 500 MG PO TABS: 500.0000 mg | ORAL_TABLET | Freq: Every day | ORAL | 0 refills | Status: DC

## 2020-01-06 MED ORDER — NAPROXEN 500 MG PO TABS: 500.0000 mg | ORAL_TABLET | Freq: Two times a day (BID) | ORAL | 0 refills | Status: DC | PRN
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
## 2020-01-06 NOTE — Discharge Instructions (Signed)
Please read the attachment on low back strains.  Please take your medications, as prescribed. You were given a prescription for Robaxin which is a muscle relaxer.  You should not drive, work, consume alcohol, or operate machinery while taking this medication as it can make you very drowsy.    Please return to the ED or seek medical attention immediately should you develop any fevers or chills, worsening back pain, pain down both of your legs, groin numbness, peeing or pooping yourself, weakness, or any other new or worsening symptoms.  Please follow-up with orthopedist for ongoing evaluation and management of your chronic back pain.  It is also imperative that you get established with a primary care provider.  Please contact your insurance company to determine which providers in the area accept your insurance.

## 2020-01-08 ENCOUNTER — Encounter (HOSPITAL_COMMUNITY): Payer: Self-pay | Admitting: Emergency Medicine

## 2020-01-08 ENCOUNTER — Other Ambulatory Visit: Payer: Self-pay

## 2020-01-08 ENCOUNTER — Emergency Department (HOSPITAL_COMMUNITY)
Admission: EM | Admit: 2020-01-08 | Discharge: 2020-01-08 | Disposition: A | Discharge status: Home / Self Care | Payer: Self-pay | Attending: Emergency Medicine | Admitting: Emergency Medicine

## 2020-01-08 DIAGNOSIS — M5441 Lumbago with sciatica, right side: Secondary | ICD-10-CM | POA: Insufficient documentation

## 2020-01-08 MED ORDER — PREDNISONE 10 MG (21) PO TBPK: ORAL_TABLET | ORAL | 0 refills | Status: DC

## 2020-01-08 MED ORDER — CYCLOBENZAPRINE HCL 10 MG PO TABS: 10.0000 mg | ORAL_TABLET | Freq: Two times a day (BID) | ORAL | 0 refills | Status: DC | PRN

## 2020-01-08 MED ORDER — KETOROLAC TROMETHAMINE 60 MG/2ML IM SOLN
30.0000 mg | Freq: Once | INTRAMUSCULAR | Status: AC
  Administered 2020-01-08: 30 mg via INTRAMUSCULAR
  Filled 2020-01-08: qty 2

## 2020-01-08 MED ORDER — LIDOCAINE 5 % EX PTCH: 1.0000 | MEDICATED_PATCH | CUTANEOUS | 0 refills | Status: DC

## 2020-01-08 NOTE — ED Notes (Signed)
Pt verbalized understanding of discharge paperwork, prescriptions and follow-up care 

## 2020-01-08 NOTE — Discharge Instructions (Addendum)
  Take it easy, but do not lay around too much as this may make any stiffness worse.  Antiinflammatory medications: Take 600 mg of ibuprofen every 6 hours or 440 mg (over the counter dose) to 500 mg (prescription dose) of naproxen every 12 hours for the next 3 days. After this time, these medications may be used as needed for pain. Take these medications with food to avoid upset stomach. Choose only one of these medications, do not take them together. Acetaminophen (generic for Tylenol): Should you continue to have additional pain while taking the ibuprofen or naproxen, you may add in acetaminophen as needed. Your daily total maximum amount of acetaminophen from all sources should be limited to 4000mg /day for persons without liver problems, or 2000mg /day for those with liver problems. Muscle Relaxers: You may take one of the following medications, but not both! Methocarbamol: This is the medication that was prescribed to 2 days ago.  You may take up to 1000 mg (2 tablets) twice daily.  Methocarbamol (generic for Robaxin) is a muscle relaxer and can help relieve stiff muscles or muscle spasms.  Do not drive or perform other dangerous activities while taking this medication as it can cause drowsiness as well as changes in reaction time and judgement. Cyclobenzaprine: Cyclobenzaprine (generic for Flexeril) is a different muscle relaxer meant to help with muscle spasms or stiff muscles.  This can be taken as an alternative to the methocarbamol.  Do not drive or perform other dangerous activities while taking this medication as it can cause drowsiness as well as changes in reaction time and judgement. Lidocaine patches: These are available via either prescription or over-the-counter. The over-the-counter option may be more economical one and are likely just as effective. There are multiple over-the-counter brands, such as Salonpas. Prednisone: Take the prednisone, as prescribed, until finished. If you are a  diabetic, please know prednisone can raise your blood sugar temporarily. Ice: May apply ice to the area over the next 24 hours for 15 minutes at a time to reduce pain, inflammation, and swelling, if present. Exercises: Be sure to perform the attached exercises starting with three times a week and working up to performing them daily. This is an essential part of preventing long term problems.  Follow up: Follow up with the orthopedic specialist, as planned. Return: Return to the ED should symptoms worsen.  For prescription assistance, may try using prescription discount sites or apps, such as goodrx.com

## 2020-01-08 NOTE — ED Triage Notes (Signed)
Pt endorses right sided back pain that radiates down right leg. Seen here 1/18 but medication has not stopped the pain.
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
## 2020-04-16 ENCOUNTER — Ambulatory Visit: Payer: Self-pay | Attending: Internal Medicine

## 2020-04-16 DIAGNOSIS — Z23 Encounter for immunization: Secondary | ICD-10-CM

## 2020-04-16 NOTE — Progress Notes (Signed)
   Covid-19 Vaccination Clinic  Name:  Tylee Yum    MRN: [id number] DOB: 1961-05-07  04/16/2020  Ms. Goranson was observed post Covid-19 immunization for 15 minutes without incident. She was provided with Vaccine Information Sheet and instruction to access the V-Safe system.   Ms. Domke was instructed to call 911 with any severe reactions post vaccine: Marland Kitchen Difficulty breathing  . Swelling of face and throat  . A fast heartbeat  . A bad rash all over body  . Dizziness and weakness   Immunizations Administered    Name Date Dose VIS Date Route   Pfizer COVID-19 Vaccine 04/16/2020  8:10 AM 0.3 mL 02/12/2019 Intramuscular   Manufacturer: [employer], [employer]   Lot: [id number]   NDC: [address]-7

## 2020-05-11 ENCOUNTER — Ambulatory Visit: Payer: Self-pay | Attending: Internal Medicine

## 2020-05-11 DIAGNOSIS — Z23 Encounter for immunization: Secondary | ICD-10-CM

## 2020-05-11 NOTE — Progress Notes (Signed)
   Covid-19 Vaccination Clinic  Name:  Chloe Campbell    MRN: [id number] DOB: Sep 22, 1961  05/11/2020  [name] was observed post Covid-19 immunization for 15 minutes without incident. She was provided with Vaccine Information Sheet and instruction to access the V-Safe system.   [name] was instructed to call 911 with any severe reactions post vaccine: Marland Kitchen Difficulty breathing  . Swelling of face and throat  . A fast heartbeat  . A bad rash all over body  . Dizziness and weakness   Immunizations Administered    Name Date Dose VIS Date Route   Pfizer COVID-19 Vaccine 05/11/2020  8:12 AM 0.3 mL 02/12/2019 Intramuscular   Manufacturer: [employer], [employer]   Lot: [id number]   NDC: [address]-0
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
## 2022-07-15 ENCOUNTER — Emergency Department (HOSPITAL_COMMUNITY): Payer: 59

## 2022-07-15 ENCOUNTER — Emergency Department (HOSPITAL_COMMUNITY)
Admission: EM | Admit: 2022-07-15 | Discharge: 2022-07-16 | Disposition: A | Discharge status: Home / Self Care | Payer: 59 | Attending: Emergency Medicine | Admitting: Emergency Medicine

## 2022-07-15 ENCOUNTER — Other Ambulatory Visit: Payer: Self-pay

## 2022-07-15 ENCOUNTER — Encounter (HOSPITAL_COMMUNITY): Payer: Self-pay | Admitting: Emergency Medicine

## 2022-07-15 DIAGNOSIS — S0990XA Unspecified injury of head, initial encounter: Secondary | ICD-10-CM | POA: Diagnosis present

## 2022-07-15 DIAGNOSIS — Z23 Encounter for immunization: Secondary | ICD-10-CM | POA: Insufficient documentation

## 2022-07-15 DIAGNOSIS — S60022A Contusion of left index finger without damage to nail, initial encounter: Secondary | ICD-10-CM | POA: Insufficient documentation

## 2022-07-15 DIAGNOSIS — S0101XA Laceration without foreign body of scalp, initial encounter: Secondary | ICD-10-CM | POA: Diagnosis not present

## 2022-07-15 MED ORDER — TETANUS-DIPHTH-ACELL PERTUSSIS 5-2.5-18.5 LF-MCG/0.5 IM SUSY
0.5000 mL | PREFILLED_SYRINGE | Freq: Once | INTRAMUSCULAR | Status: AC
  Administered 2022-07-16: 0.5 mL via INTRAMUSCULAR
  Filled 2022-07-15: qty 0.5

## 2022-07-15 MED ORDER — NAPROXEN 250 MG PO TABS
500.0000 mg | ORAL_TABLET | Freq: Once | ORAL | Status: AC
  Administered 2022-07-16: 500 mg via ORAL
  Filled 2022-07-15: qty 2

## 2022-07-15 MED ORDER — OXYCODONE-ACETAMINOPHEN 5-325 MG PO TABS
2.0000 | ORAL_TABLET | Freq: Once | ORAL | Status: AC
  Administered 2022-07-16: 2 via ORAL
  Filled 2022-07-15: qty 2
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
## 2022-07-16 ENCOUNTER — Emergency Department (HOSPITAL_COMMUNITY): Payer: 59

## 2022-07-16 MED ORDER — NAPROXEN 500 MG PO TABS: 500.0000 mg | ORAL_TABLET | Freq: Two times a day (BID) | ORAL | 0 refills | Status: DC | PRN

## 2022-07-16 NOTE — Discharge Instructions (Signed)
You imaging in the ED today is reassuring.  It is possible that you may have a mild concussion.  A concussion is a diagnosis that is made clinically and does not show any abnormal results on imaging such as a CT scan or MRI.    A concussion may cause a persistent headache over the next few days. This can be brought on or worsened by loud sounds or bright lights. Try to avoid excessive use of cell phones, television, video games as this may worsening headaches.  Avoid strenuous activity and heavy lifting over the next few days.  If you develop severe worsening of your headache, vision changes or loss, uncontrolled vomiting, numbness or tingling to one side of your body, difficulty walking or lifting your arms or legs, return promptly to the emergency department for repeat evaluation.  Return for staple removal in 1 week. Take tylenol or Motrin for management of pain/soreness.
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
## 2022-08-05 ENCOUNTER — Other Ambulatory Visit: Payer: Self-pay

## 2022-08-05 ENCOUNTER — Encounter (HOSPITAL_COMMUNITY): Payer: Self-pay | Admitting: Emergency Medicine

## 2022-08-05 ENCOUNTER — Emergency Department (HOSPITAL_COMMUNITY): Payer: 59

## 2022-08-05 ENCOUNTER — Emergency Department (HOSPITAL_COMMUNITY)
Admission: EM | Admit: 2022-08-05 | Discharge: 2022-08-05 | Disposition: A | Discharge status: Home / Self Care | Payer: 59 | Attending: Emergency Medicine | Admitting: Emergency Medicine

## 2022-08-05 DIAGNOSIS — Z4802 Encounter for removal of sutures: Secondary | ICD-10-CM | POA: Insufficient documentation

## 2022-08-05 DIAGNOSIS — S2241XA Multiple fractures of ribs, right side, initial encounter for closed fracture: Secondary | ICD-10-CM | POA: Insufficient documentation

## 2022-08-05 DIAGNOSIS — S299XXA Unspecified injury of thorax, initial encounter: Secondary | ICD-10-CM | POA: Diagnosis present

## 2022-08-05 MED ORDER — OXYCODONE-ACETAMINOPHEN 5-325 MG PO TABS: 1.0000 | ORAL_TABLET | Freq: Four times a day (QID) | ORAL | 0 refills | Status: DC | PRN
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
## 2023-09-09 ENCOUNTER — Emergency Department (HOSPITAL_COMMUNITY)
Admission: EM | Admit: 2023-09-09 | Discharge: 2023-09-10 | Disposition: A | Discharge status: Home / Self Care | Payer: 59 | Attending: Emergency Medicine | Admitting: Emergency Medicine

## 2023-09-09 ENCOUNTER — Emergency Department (HOSPITAL_COMMUNITY): Payer: 59

## 2023-09-09 ENCOUNTER — Encounter (HOSPITAL_COMMUNITY): Payer: Self-pay

## 2023-09-09 ENCOUNTER — Other Ambulatory Visit: Payer: Self-pay

## 2023-09-09 DIAGNOSIS — S52202A Unspecified fracture of shaft of left ulna, initial encounter for closed fracture: Secondary | ICD-10-CM

## 2023-09-09 DIAGNOSIS — S6992XA Unspecified injury of left wrist, hand and finger(s), initial encounter: Secondary | ICD-10-CM | POA: Diagnosis present

## 2023-09-09 DIAGNOSIS — S62342A Nondisplaced fracture of base of third metacarpal bone, right hand, initial encounter for closed fracture: Secondary | ICD-10-CM
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
## 2023-09-10 ENCOUNTER — Emergency Department (HOSPITAL_COMMUNITY): Payer: 59

## 2023-09-10 MED ORDER — OXYCODONE-ACETAMINOPHEN 5-325 MG PO TABS: 1.0000 | ORAL_TABLET | Freq: Four times a day (QID) | ORAL | 0 refills | Status: DC | PRN

## 2023-09-10 MED ORDER — OXYCODONE-ACETAMINOPHEN 5-325 MG PO TABS
2.0000 | ORAL_TABLET | Freq: Once | ORAL | Status: AC
  Administered 2023-09-10: 2 via ORAL
  Filled 2023-09-10: qty 2

## 2023-09-10 MED ORDER — NAPROXEN 500 MG PO TABS: 500.0000 mg | ORAL_TABLET | Freq: Two times a day (BID) | ORAL | 0 refills | Status: DC
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
## 2023-09-10 NOTE — Discharge Instructions (Addendum)
You were found to have multiple fractures on your workup today.  You have been given a referral to orthopedics for follow-up to ensure proper healing of your broken bones.  Keep your splint on at all times to promote healing and stability of your fractures.  We recommend icing over top of your splints to try and help reduce inflammation.  Take naproxen as prescribed for pain control.  You may use Percocet as prescribed for management of severe pain.  Do not drive or drink alcohol after taking these medications as it may make you drowsy and impair your judgment.  You may follow-up with your primary care doctor in the interim, especially if prolonged work excuse is indicated.  Return for new or concerning symptoms.

## 2023-09-10 NOTE — Progress Notes (Signed)
Orthopedic Tech Progress Note Patient Details:  Chloe Campbell September 09, 1961 [id number]  Ortho Devices Type of Ortho Device: Arm sling, Sugartong splint, Volar splint Ortho Device/Splint Location: rue volar, lue sugartong. bi-lateral arm slings Ortho Device/Splint Interventions: Ordered, Application, Adjustment   Post Interventions Patient Tolerated: Well Instructions Provided: Care of device, Adjustment of device  Trinna Post 09/10/2023, 3:52 AM

## 2023-09-10 NOTE — ED Notes (Signed)
Called Ortho Tech, no answer

## 2023-10-25 ENCOUNTER — Other Ambulatory Visit: Payer: Self-pay

## 2023-10-25 ENCOUNTER — Encounter (HOSPITAL_COMMUNITY): Payer: Self-pay | Admitting: *Deleted

## 2023-10-25 ENCOUNTER — Emergency Department (HOSPITAL_COMMUNITY)
Admission: EM | Admit: 2023-10-25 | Discharge: 2023-10-25 | Disposition: A | Discharge status: Home / Self Care | Payer: 59 | Attending: Emergency Medicine | Admitting: Emergency Medicine

## 2023-10-25 DIAGNOSIS — M542 Cervicalgia: Secondary | ICD-10-CM | POA: Diagnosis present

## 2023-10-25 LAB — COMPREHENSIVE METABOLIC PANEL
ALT: 12 U/L (ref 0–44)
AST: 14 U/L — ABNORMAL LOW (ref 15–41)
Albumin: 3.5 g/dL (ref 3.5–5.0)
Alkaline Phosphatase: 123 U/L (ref 38–126)
Anion gap: 9 (ref 5–15)
BUN: 23 mg/dL — ABNORMAL HIGH (ref 6–20)
CO2: 23 mmol/L (ref 22–32)
Calcium: 9.7 mg/dL (ref 8.9–10.3)
Chloride: 110 mmol/L (ref 98–111)
Creatinine, Ser: 0.88 mg/dL (ref 0.44–1.00)
GFR, Estimated: >60 mL/min (ref >60)
Glucose, Bld: 109 mg/dL — ABNORMAL HIGH (ref 70–99)
Potassium: 3.5 mmol/L (ref 3.5–5.1)
Sodium: 142 mmol/L (ref 135–145)
Total Bilirubin: 0.3 mg/dL (ref <1.2)
Total Protein: 6.9 g/dL (ref 6.5–8.1)

## 2023-10-25 LAB — CBC
HCT: 33.9 % — ABNORMAL LOW (ref 36.0–46.0)
Hemoglobin: 10.8 g/dL — ABNORMAL LOW (ref 12.0–15.0)
MCH: 27.7 pg (ref 26.0–34.0)
MCHC: 31.9 g/dL (ref 30.0–36.0)
MCV: 86.9 fL (ref 80.0–100.0)
Platelets: 270 10*3/uL (ref 150–400)
RBC: 3.9 MIL/uL (ref 3.87–5.11)
RDW: 15.5 % (ref 11.5–15.5)
WBC: 5.5 10*3/uL (ref 4.0–10.5)
nRBC: 0 % (ref 0.0–0.2)

## 2023-10-25 MED ORDER — KETOROLAC TROMETHAMINE 30 MG/ML IJ SOLN
30.0000 mg | Freq: Once | INTRAMUSCULAR | Status: AC
  Administered 2023-10-25: 30 mg via INTRAMUSCULAR
  Filled 2023-10-25: qty 1

## 2023-10-25 MED ORDER — METHOCARBAMOL 500 MG PO TABS: 500.0000 mg | ORAL_TABLET | Freq: Two times a day (BID) | ORAL | 0 refills | Status: DC

## 2023-10-25 MED ORDER — LIDOCAINE 5 % EX PTCH: 1.0000 | MEDICATED_PATCH | CUTANEOUS | 0 refills | Status: DC

## 2023-10-25 MED ORDER — OXYCODONE-ACETAMINOPHEN 5-325 MG PO TABS
1.0000 | ORAL_TABLET | Freq: Four times a day (QID) | ORAL | 0 refills | Status: DC | PRN
Start: 2023-10-25 — End: 2024-02-03

## 2023-10-25 MED ORDER — NAPROXEN 500 MG PO TABS: 500.0000 mg | ORAL_TABLET | Freq: Two times a day (BID) | ORAL | 0 refills | Status: DC

## 2023-10-25 MED ORDER — METHOCARBAMOL 500 MG PO TABS
500.0000 mg | ORAL_TABLET | Freq: Once | ORAL | Status: AC
  Administered 2023-10-25: 500 mg via ORAL
  Filled 2023-10-25: qty 1

## 2023-10-25 MED ORDER — HYDROCODONE-ACETAMINOPHEN 5-325 MG PO TABS
1.0000 | ORAL_TABLET | Freq: Once | ORAL | Status: AC
  Administered 2023-10-25: 1 via ORAL
  Filled 2023-10-25: qty 1

## 2023-10-25 NOTE — ED Triage Notes (Signed)
The pt has a crick in her neck for 4 days no known injuries    no known temp

## 2023-10-25 NOTE — Discharge Instructions (Signed)
It was a pleasure taking care of you in the ED  You are likely having muscle spasm and torticollis.  We have written you for a few medications to take for your symptoms  Take as prescribed  Do not drive or operate heavy machinery, make life for decisions while taking these medications.  Follow-up outpatient, return for any worsening symptoms

## 2023-10-25 NOTE — ED Provider Notes (Signed)
[facility] AT [facility] Provider Note   CSN: [id number] Arrival date & time: 10/25/23  1533    History  Chief Complaint  Patient presents with   Torticollis    Chloe Campbell is a 62 y.o. female here for evaluation of neck pain.  Started on Saturday.  She woke up in the morning and thought she had "slept wrong".  She has pain to the posterior aspect of the right side of her neck which goes into her posterior shoulder.  Worse with movement.  She has been taking Goody powders without relief.  No recent injury or trauma.  No headache, vision changes, numbness, weakness, no chiropractic adjustments.  No chest pain, shortness of breath.  Has never had anything like this previously. No fever, hx of IVDU, sore throat, difficulty swallowing, change in voice. HPI     Home Medications Prior to Admission medications   Medication Sig Start Date End Date Taking? Authorizing Provider  lidocaine (LIDODERM) 5 % Place 1 patch onto the skin daily. Remove & Discard patch within 12 hours or as directed by MD 10/25/23  Yes Emeree Mahler A, PA-C  methocarbamol (ROBAXIN) 500 MG tablet Take 1 tablet (500 mg total) by mouth 2 (two) times daily. 10/25/23  Yes Johathon Overturf A, PA-C  naproxen (NAPROSYN) 500 MG tablet Take 1 tablet (500 mg total) by mouth 2 (two) times daily. 10/25/23  Yes Kenston Longton A, PA-C  oxyCODONE-acetaminophen (PERCOCET/ROXICET) 5-325 MG tablet Take 1 tablet by mouth every 6 (six) hours as needed for severe pain (pain score 7-10). 10/25/23  Yes Desirey Keahey A, PA-C  predniSONE (STERAPRED UNI-PAK 21 TAB) 10 MG (21) TBPK tablet Take 6 tabs (60mg ) day 1, 5 tabs (50mg ) day 2, 4 tabs (40mg ) day 3, 3 tabs (30mg ) day 4, 2 tabs (20mg ) day 5, and 1 tab (10mg ) day 6. 01/08/20   Joy, Shawn C, PA-C      Allergies    Patient has no known allergies.    Review of Systems   Review of Systems  Constitutional: Negative.   HENT: Negative.    Respiratory:  Negative.    Cardiovascular: Negative.   Gastrointestinal: Negative.   Genitourinary: Negative.   Musculoskeletal:  Positive for neck pain. Negative for arthralgias, back pain, gait problem, joint swelling, myalgias and neck stiffness.  Skin: Negative.   Neurological: Negative.   All other systems reviewed and are negative.   Physical Exam Updated Vital Signs BP 132/68 (BP Location: Left Arm)   Pulse (!) 56   Temp 98 F (36.7 C)   Resp 17   Ht 5\' 4"  (1.626 m)   Wt 85.7 kg   LMP 12/22/2005   SpO2 100%   BMI 32.43 kg/m  Physical Exam Vitals and nursing note reviewed.  Constitutional:      General: She is not in acute distress.    Appearance: She is well-developed. She is not ill-appearing, toxic-appearing or diaphoretic.  HENT:     Head: Normocephalic and atraumatic.  Eyes:     Pupils: Pupils are equal, round, and reactive to light.  Neck:     Trachea: Trachea and phonation normal.     Meningeal: Brudzinski's sign and Kernig's sign absent.      Comments: Palpable spasm to right trapezius, SCM Cardiovascular:     Rate and Rhythm: Normal rate.     Pulses: Normal pulses.          Radial pulses are 2+ on the right side and  2+ on the left side.     Heart sounds: Normal heart sounds.  Pulmonary:     Effort: Pulmonary effort is normal. No respiratory distress.     Breath sounds: Normal breath sounds.  Abdominal:     General: There is no distension.  Musculoskeletal:        General: Normal range of motion.     Cervical back: Full passive range of motion without pain, normal range of motion and neck supple. No edema, erythema, signs of trauma, rigidity or crepitus. Muscular tenderness present. No spinous process tenderness. Normal range of motion.     Comments: No midline cervical tenderness, thoracic tenderness.  Nontender clavicle, scapula.  Lifts right shoulder overhead without difficulty.  Skin:    General: Skin is warm and dry.     Capillary Refill: Capillary refill  takes less than 2 seconds.     Comments: No edema, erythema or warmth.  Compartments soft.  No fluctuance induration  Neurological:     General: No focal deficit present.     Mental Status: She is alert.     Comments: Equal handgrip Intact sensation  Psychiatric:        Mood and Affect: Mood normal.    ED Results / Procedures / Treatments   Labs (all labs ordered are listed, but only abnormal results are displayed) Labs Reviewed  COMPREHENSIVE METABOLIC PANEL - Abnormal; Notable for the following components:      Result Value   Glucose, Bld 109 (*)    BUN 23 (*)    AST 14 (*)    All other components within normal limits  CBC - Abnormal; Notable for the following components:   Hemoglobin 10.8 (*)    HCT 33.9 (*)    All other components within normal limits    EKG None  Radiology No results found.  Procedures Procedures    Medications Ordered in ED Medications  ketorolac (TORADOL) 30 MG/ML injection 30 mg (30 mg Intramuscular Given 10/25/23 2220)  methocarbamol (ROBAXIN) tablet 500 mg (500 mg Oral Given 10/25/23 2219)  HYDROcodone-acetaminophen (NORCO/VICODIN) 5-325 MG per tablet 1 tablet (1 tablet Oral Given 10/25/23 2219)    ED Course/ Medical Decision Making/ A&P   62 year old here for evaluation of neck pain.  Started on Saturday. NV intact.  Reproducible pain and spasms.  No midline cervical tenderness, no recent injury or trauma.  No chiropractic adjustments.  No headache, numbness, weakness difficulty with word finding, vision changes.  Suspect she likely has muscle spasms, torticollis  Labs obtained from triage personally viewed and interpreted:  CBC without leukocytosis, hemoglobin 10.8 Metabolic panel glucose 109  Discussed results with patient.  Will treat symptomatically.  Do not feel she needs further labs or imaging.  Low suspicion for acute CVA, dissection, traumatic disc herniation causing cord compression, discitis, osteomyelitis, transverse myelitis,  abscess, meningitis, PTA, RPA.  The patient has been appropriately medically screened and/or stabilized in the ED. I have low suspicion for any other emergent medical condition which would require further screening, evaluation or treatment in the ED or require inpatient management.  Patient is hemodynamically stable and in no acute distress.  Patient able to ambulate in department prior to ED.  Evaluation does not show acute pathology that would require ongoing or additional emergent interventions while in the emergency department or further inpatient treatment.  I have discussed the diagnosis with the patient and answered all questions.  Pain is been managed while in the emergency department and patient has  no further complaints prior to discharge.  Patient is comfortable with plan discussed in room and is stable for discharge at this time.  I have discussed strict return precautions for returning to the emergency department.  Patient was encouraged to follow-up with PCP/specialist refer to at discharge.                                  Medical Decision Making Amount and/or Complexity of Data Reviewed External Data Reviewed: labs and radiology. Labs: ordered. Decision-making details documented in ED Course.  Risk OTC drugs. Prescription drug management. Decision regarding hospitalization. Diagnosis or treatment significantly limited by social determinants of health.         Final Clinical Impression(s) / ED Diagnoses Final diagnoses:  Neck pain    Rx / DC Orders ED Discharge Orders          Ordered    methocarbamol (ROBAXIN) 500 MG tablet  2 times daily        10/25/23 2217    naproxen (NAPROSYN) 500 MG tablet  2 times daily        10/25/23 2217    lidocaine (LIDODERM) 5 %  Every 24 hours        10/25/23 2217    oxyCODONE-acetaminophen (PERCOCET/ROXICET) 5-325 MG tablet  Every 6 hours PRN        10/25/23 2217              Creg Gilmer A, PA-C 10/25/23  2248    Gloris Manchester, MD 10/25/23 2324

## 2024-02-03 ENCOUNTER — Ambulatory Visit (HOSPITAL_COMMUNITY)
Admission: EM | Admit: 2024-02-03 | Discharge: 2024-02-03 | Disposition: A | Discharge status: Home / Self Care | Payer: 59 | Attending: Internal Medicine | Admitting: Internal Medicine

## 2024-02-03 ENCOUNTER — Encounter (HOSPITAL_COMMUNITY): Payer: Self-pay

## 2024-02-03 DIAGNOSIS — J101 Influenza due to other identified influenza virus with other respiratory manifestations: Secondary | ICD-10-CM

## 2024-02-03 LAB — POC COVID19/FLU A&B COMBO
Covid Antigen, POC: NEGATIVE
Influenza A Antigen, POC: POSITIVE — AB
Influenza B Antigen, POC: NEGATIVE

## 2024-02-03 MED ORDER — ACETAMINOPHEN 325 MG PO TABS
ORAL_TABLET | ORAL | Status: AC
  Filled 2024-02-03: qty 2

## 2024-02-03 MED ORDER — PREDNISONE 20 MG PO TABS: 40.0000 mg | ORAL_TABLET | Freq: Every day | ORAL | 0 refills | Status: AC

## 2024-02-03 MED ORDER — PROMETHAZINE-DM 6.25-15 MG/5ML PO SYRP
5.0000 mL | ORAL_SOLUTION | Freq: Three times a day (TID) | ORAL | 0 refills | Status: AC | PRN
Start: 2024-02-03 — End: ?

## 2024-02-03 MED ORDER — OSELTAMIVIR PHOSPHATE 75 MG PO CAPS: 75.0000 mg | ORAL_CAPSULE | Freq: Two times a day (BID) | ORAL | 0 refills | Status: AC

## 2024-02-03 MED ORDER — ACETAMINOPHEN 325 MG PO TABS
650.0000 mg | ORAL_TABLET | Freq: Once | ORAL | Status: AC
  Administered 2024-02-03: 650 mg via ORAL

## 2024-02-03 NOTE — Discharge Instructions (Addendum)
Flu A, flu B and COVID testing done today.  Flu A is positive.  This is a virus and does not require antibiotics.  This typically takes 7 to 10 days to completely resolve.  We can treat the symptoms with the following: Tamiflu 75 mg twice daily for 5 days. Stop this medication if you develop side effects such as headache, nausea, vomiting, altered mental status.  Promethazine DM 5 mL every 8 hours as needed for cough.  Use caution as this medication can cause drowsiness. Prednisone 40 mg (2 tablets) once daily for 5 days. Take this in the morning.  This is a steroid to help with inflammation and pain. Avoid returning to work until you are 24 hours fever free without fever reducing medication.  Rest and stay hydrated.   Return to urgent care or PCP if symptoms worsen or fail to resolve.

## 2024-02-03 NOTE — ED Triage Notes (Signed)
Patient here today with c/o cough, body aches, chills, sweats, and runny nose since Wednesday. She has been taking Alka Seltzer Cold and Theraflu with no relief. Other members in her household have also been sick.

## 2024-02-03 NOTE — ED Provider Notes (Signed)
[facility]    CSN: [id number] Arrival date & time: 02/03/24  1158      History   Chief Complaint No chief complaint on file.   HPI Chloe Campbell is a 63 y.o. female.   63 y.o. female who presents to urgent care with complaints of cough, runny nose, body aches, fevers and chills since Wednesday. Will have coughing spells that result in vomiting. Has been using alka seltzer cold/flu, BC powders and theraflu but not helping much. Several people at work and home have been sick recently. Her grandson had strep throat but she does not have much of a sore throat.  She does not know if anyone's been diagnosed with influenza.  She reports the cough is much worse at night.  She is very fatigued as well.     Past Medical History:  Diagnosis Date   Anemia     There are no active problems to display for this patient.   No past surgical history on file.  OB History   No obstetric history on file.      Home Medications    Prior to Admission medications   Medication Sig Start Date End Date Taking? Authorizing Provider  lidocaine (LIDODERM) 5 % Place 1 patch onto the skin daily. Remove & Discard patch within 12 hours or as directed by MD 10/25/23   Henderly, Britni A, PA-C  methocarbamol (ROBAXIN) 500 MG tablet Take 1 tablet (500 mg total) by mouth 2 (two) times daily. 10/25/23   Henderly, Britni A, PA-C  naproxen (NAPROSYN) 500 MG tablet Take 1 tablet (500 mg total) by mouth 2 (two) times daily. 10/25/23   Henderly, Britni A, PA-C  oxyCODONE-acetaminophen (PERCOCET/ROXICET) 5-325 MG tablet Take 1 tablet by mouth every 6 (six) hours as needed for severe pain (pain score 7-10). 10/25/23   Henderly, Britni A, PA-C  predniSONE (STERAPRED UNI-PAK 21 TAB) 10 MG (21) TBPK tablet Take 6 tabs (60mg ) day 1, 5 tabs (50mg ) day 2, 4 tabs (40mg ) day 3, 3 tabs (30mg ) day 4, 2 tabs (20mg ) day 5, and 1 tab (10mg ) day 6. 01/08/20   Joy, Hillard Danker, PA-C    Family History No family history on  file.  Social History Social History   Tobacco Use   Smoking status: Never  Substance Use Topics   Alcohol use: No   Drug use: No     Allergies   Patient has no known allergies.   Review of Systems Review of Systems  Constitutional:  Positive for activity change, appetite change, chills and fever.  HENT:  Positive for congestion and rhinorrhea. Negative for ear pain and sore throat.   Eyes:  Negative for pain and visual disturbance.  Respiratory:  Positive for cough. Negative for shortness of breath.   Cardiovascular:  Negative for chest pain and palpitations.  Gastrointestinal:  Negative for abdominal pain and vomiting.  Genitourinary:  Negative for dysuria and hematuria.  Musculoskeletal:  Negative for arthralgias and back pain.       Generalized body aches  Skin:  Negative for color change and rash.  Neurological:  Negative for seizures and syncope.  All other systems reviewed and are negative.    Physical Exam Triage Vital Signs ED Triage Vitals  Encounter Vitals Group     BP      Systolic BP Percentile      Diastolic BP Percentile      Pulse      Resp      Temp  Temp src      SpO2      Weight      Height      Head Circumference      Peak Flow      Pain Score      Pain Loc      Pain Education      Exclude from Growth Chart    No data found.  Updated Vital Signs LMP 12/22/2005   Visual Acuity Right Eye Distance:   Left Eye Distance:   Bilateral Distance:    Right Eye Near:   Left Eye Near:    Bilateral Near:     Physical Exam Vitals and nursing note reviewed.  Constitutional:      General: She is not in acute distress.    Appearance: She is well-developed.     Comments: Appears to not feel well  HENT:     Head: Normocephalic and atraumatic.     Right Ear: Tympanic membrane normal.     Left Ear: Tympanic membrane normal.     Nose: Congestion present.     Mouth/Throat:     Mouth: Mucous membranes are moist.  Eyes:      Conjunctiva/sclera: Conjunctivae normal.  Cardiovascular:     Rate and Rhythm: Normal rate and regular rhythm.     Heart sounds: No murmur heard. Pulmonary:     Effort: Pulmonary effort is normal. No respiratory distress.     Breath sounds: Normal breath sounds. No decreased breath sounds, wheezing or rhonchi.  Abdominal:     Palpations: Abdomen is soft.     Tenderness: There is no abdominal tenderness.  Musculoskeletal:        General: No swelling.     Cervical back: Neck supple.  Skin:    General: Skin is warm and dry.     Capillary Refill: Capillary refill takes less than 2 seconds.  Neurological:     Mental Status: She is alert.  Psychiatric:        Mood and Affect: Mood normal.      UC Treatments / Results  Labs (all labs ordered are listed, but only abnormal results are displayed) Labs Reviewed - No data to display  EKG   Radiology No results found.  Procedures Procedures (including critical care time)  Medications Ordered in UC Medications - No data to display  Initial Impression / Assessment and Plan / UC Course  I have reviewed the triage vital signs and the nursing notes.  Pertinent labs & imaging results that were available during my care of the patient were reviewed by me and considered in my medical decision making (see chart for details).     Influenza A  Flu A, flu B and COVID testing done today.  Flu A is positive.  This is a virus and does not require antibiotics.  This typically takes 7 to 10 days to completely resolve.  We can treat the symptoms with the following: Tamiflu 75 mg twice daily for 5 days. Stop this medication if you develop side effects such as headache, nausea, vomiting, altered mental status.  Promethazine DM 5 mL every 8 hours as needed for cough.  Use caution as this medication can cause drowsiness. Prednisone 40 mg (2 tablets) once daily for 5 days. Take this in the morning.  This is a steroid to help with inflammation and  pain. Avoid returning to work until you are 24 hours fever free without fever reducing medication.  Rest and stay hydrated.  Return to urgent care or PCP if symptoms worsen or fail to resolve.     Final Clinical Impressions(s) / UC Diagnoses   Final diagnoses:  None   Discharge Instructions   None    ED Prescriptions   None    PDMP not reviewed this encounter.   Landis Martins, [place] 02/03/24 1415

## 2024-04-19 ENCOUNTER — Encounter (HOSPITAL_COMMUNITY): Payer: Self-pay | Admitting: *Deleted

## 2024-04-19 ENCOUNTER — Emergency Department (HOSPITAL_COMMUNITY)
Admission: EM | Admit: 2024-04-19 | Discharge: 2024-04-20 | Disposition: A | Discharge status: Home / Self Care | Payer: Self-pay | Attending: Emergency Medicine | Admitting: Emergency Medicine

## 2024-04-19 ENCOUNTER — Other Ambulatory Visit: Payer: Self-pay

## 2024-04-19 DIAGNOSIS — L02811 Cutaneous abscess of head [any part, except face]: Secondary | ICD-10-CM | POA: Diagnosis not present

## 2024-04-19 DIAGNOSIS — R220 Localized swelling, mass and lump, head: Secondary | ICD-10-CM | POA: Diagnosis not present

## 2024-04-19 DIAGNOSIS — R519 Headache, unspecified: Secondary | ICD-10-CM | POA: Diagnosis not present

## 2024-04-19 NOTE — ED Triage Notes (Signed)
 The pt has a swollen area to the rt side of her head   since last week  she feels like she has had a temp and the area is painful there is sl drainage from the area also

## 2024-04-20 ENCOUNTER — Emergency Department (HOSPITAL_COMMUNITY): Payer: Self-pay

## 2024-04-20 DIAGNOSIS — R519 Headache, unspecified: Secondary | ICD-10-CM | POA: Diagnosis not present

## 2024-04-20 LAB — CBC WITH DIFFERENTIAL/PLATELET
Abs Immature Granulocytes: 0.01 10*3/uL (ref 0.00–0.07)
Basophils Absolute: 0 10*3/uL (ref 0.0–0.1)
Basophils Relative: 1 %
Eosinophils Absolute: 0.4 10*3/uL (ref 0.0–0.5)
Eosinophils Relative: 7 %
HCT: 27.3 % — ABNORMAL LOW (ref 36.0–46.0)
Hemoglobin: 8.4 g/dL — ABNORMAL LOW (ref 12.0–15.0)
Immature Granulocytes: 0 %
Lymphocytes Relative: 27 %
Lymphs Abs: 1.7 10*3/uL (ref 0.7–4.0)
MCH: 27 pg (ref 26.0–34.0)
MCHC: 30.8 g/dL (ref 30.0–36.0)
MCV: 87.8 fL (ref 80.0–100.0)
Monocytes Absolute: 0.6 10*3/uL (ref 0.1–1.0)
Monocytes Relative: 9 %
Neutro Abs: 3.5 10*3/uL (ref 1.7–7.7)
Neutrophils Relative %: 56 %
Platelets: 317 10*3/uL (ref 150–400)
RBC: 3.11 MIL/uL — ABNORMAL LOW (ref 3.87–5.11)
RDW: 15 % (ref 11.5–15.5)
WBC: 6.3 10*3/uL (ref 4.0–10.5)
nRBC: 0 % (ref 0.0–0.2)

## 2024-04-20 LAB — BASIC METABOLIC PANEL WITH GFR
Anion gap: 4 — ABNORMAL LOW (ref 5–15)
BUN: 15 mg/dL (ref 6–20)
CO2: 25 mmol/L (ref 22–32)
Calcium: 8.7 mg/dL — ABNORMAL LOW (ref 8.9–10.3)
Chloride: 107 mmol/L (ref 98–111)
Creatinine, Ser: 0.73 mg/dL (ref 0.44–1.00)
GFR, Estimated: >60 mL/min (ref >60)
Glucose, Bld: 124 mg/dL — ABNORMAL HIGH (ref 70–99)
Potassium: 3.3 mmol/L — ABNORMAL LOW (ref 3.5–5.1)
Sodium: 136 mmol/L (ref 135–145)

## 2024-04-20 LAB — RAPID HIV SCREEN (HIV 1/2 AB+AG)
HIV 1/2 Antibodies: NONREACTIVE
HIV-1 P24 Antigen - HIV24: NONREACTIVE

## 2024-04-20 MED ORDER — HYDROCODONE-ACETAMINOPHEN 5-325 MG PO TABS
1.0000 | ORAL_TABLET | Freq: Once | ORAL | Status: AC
  Administered 2024-04-20: 1 via ORAL
  Filled 2024-04-20: qty 1

## 2024-04-20 MED ORDER — OXYCODONE-ACETAMINOPHEN 5-325 MG PO TABS
1.0000 | ORAL_TABLET | Freq: Once | ORAL | Status: AC
  Administered 2024-04-20: 1 via ORAL
  Filled 2024-04-20: qty 1

## 2024-04-20 MED ORDER — DOXYCYCLINE HYCLATE 100 MG PO CAPS: 100.0000 mg | ORAL_CAPSULE | Freq: Two times a day (BID) | ORAL | 0 refills | Status: AC

## 2024-04-20 MED ORDER — LIDOCAINE-EPINEPHRINE (PF) 2 %-1:200000 IJ SOLN
20.0000 mL | Freq: Once | INTRAMUSCULAR | Status: AC
  Administered 2024-04-20: 20 mL
  Filled 2024-04-20: qty 20

## 2024-04-20 NOTE — Discharge Instructions (Signed)
 Irrigate wound with warm water 1 or 2 times daily for the next several days.  Take the antibiotics as prescribed.  Follow-up with your primary doctor for a wound check next week. Return to the ED with new or worsening problems.

## 2024-04-20 NOTE — ED Provider Notes (Signed)
 [place] [facility] AT [facility] Provider Note   CSN: [id number] Arrival date & time: 04/19/24  2024     History  No chief complaint on file.   Chloe Campbell is a 63 y.o. female.  Patient with "lump" to the right side of her head.  Believes she was bitten by someThing but does not know what.  Denies falling or hitting her head.  Complains of pain to her right scalp that radiates down her scalp to her neck.  No nausea or vomiting.  No fever.  She attempted to use hot packs at home as well as peroxide without relief.  There is a draining wound to her right scalp.  Denies a history of diabetes.  No focal weakness, numbness or tingling.  No chest pain or shortness of breath.  No visual changes.  The history is provided by the patient.       Home Medications Prior to Admission medications   Medication Sig Start Date End Date Taking? Authorizing Provider  oseltamivir  (TAMIFLU ) 75 MG capsule Take 1 capsule (75 mg total) by mouth every 12 (twelve) hours. 02/03/24   White, Elizabeth A, PA-C  promethazine -dextromethorphan (PROMETHAZINE -DM) 6.25-15 MG/5ML syrup Take 5 mLs by mouth every 8 (eight) hours as needed for cough. 02/03/24   Kreg Pesa, PA-C      Allergies    Patient has no known allergies.    Review of Systems   Review of Systems  Constitutional:  Negative for activity change, appetite change and fever.  HENT:  Negative for congestion and rhinorrhea.   Respiratory:  Negative for cough, chest tightness and shortness of breath.   Cardiovascular:  Negative for chest pain.  Gastrointestinal:  Negative for abdominal pain, nausea and vomiting.  Genitourinary:  Negative for dysuria and hematuria.  Musculoskeletal:  Negative for arthralgias and myalgias.  Skin:  Positive for wound.  Neurological:  Negative for dizziness, weakness and headaches.   all other systems are negative except as noted in the HPI and PMH.    Physical Exam Updated Vital  Signs BP 131/66 (BP Location: Right Arm)   Pulse 88   Temp 98.7 F (37.1 C) (Oral)   Resp 17   Ht 5\' 4"  (1.626 m)   Wt 82.1 kg   LMP 12/22/2005   SpO2 97%   BMI 31.07 kg/m  Physical Exam Vitals and nursing note reviewed.  Constitutional:      General: She is not in acute distress.    Appearance: She is well-developed.  HENT:     Head: Normocephalic.     Comments: 2 cm apparent abscess to right scalp with purulent drainage.    Mouth/Throat:     Pharynx: No oropharyngeal exudate.  Eyes:     Conjunctiva/sclera: Conjunctivae normal.     Pupils: Pupils are equal, round, and reactive to light.  Neck:     Comments: No meningismus. Cardiovascular:     Rate and Rhythm: Normal rate and regular rhythm.     Heart sounds: Normal heart sounds. No murmur heard. Pulmonary:     Effort: Pulmonary effort is normal. No respiratory distress.     Breath sounds: Normal breath sounds.  Abdominal:     Palpations: Abdomen is soft.     Tenderness: There is no abdominal tenderness. There is no guarding or rebound.  Musculoskeletal:        General: No tenderness. Normal range of motion.     Cervical back: Normal range of motion and neck  supple.  Skin:    General: Skin is warm.  Neurological:     Mental Status: She is alert and oriented to person, place, and time.     Cranial Nerves: No cranial nerve deficit.     Motor: No abnormal muscle tone.     Coordination: Coordination normal.     Comments: No ataxia on finger to nose bilaterally. No pronator drift. 5/5 strength throughout. CN 2-12 intact.Equal grip strength. Sensation intact.   Psychiatric:        Behavior: Behavior normal.     ED Results / Procedures / Treatments   Labs (all labs ordered are listed, but only abnormal results are displayed) Labs Reviewed  CBC WITH DIFFERENTIAL/PLATELET  BASIC METABOLIC PANEL WITH GFR  RAPID HIV SCREEN (HIV 1/2 AB+AG)    EKG None  Radiology CT Head Wo Contrast Result Date: 04/20/2024 CLINICAL  DATA:  New onset headache. EXAM: CT HEAD WITHOUT CONTRAST TECHNIQUE: Contiguous axial images were obtained from the base of the skull through the vertex without intravenous contrast. RADIATION DOSE REDUCTION: This exam was performed according to the departmental dose-optimization program which includes automated exposure control, adjustment of the mA and/or kV according to patient size and/or use of iterative reconstruction technique. COMPARISON:  07/15/2022 FINDINGS: Brain: There is no evidence for acute hemorrhage, hydrocephalus, mass lesion, or abnormal extra-axial fluid collection. No definite CT evidence for acute infarction. Vascular: No hyperdense vessel or unexpected calcification. Skull: No evidence for fracture. No worrisome lytic or sclerotic lesion. 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 Visualized portions of the globes and intraorbital fat are unremarkable. Other: None. IMPRESSION: 1. No acute intracranial abnormality. 2. 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 Electronically Signed   By: Donnal Fusi M.D.   On: 04/20/2024 06:44    Procedures .Incision and Drainage  Date/Time: 04/20/2024 5:53 AM  Performed by: Earma Gloss, MD Authorized by: Earma Gloss, MD   Consent:    Consent obtained:  Verbal   Consent given by:  Patient   Risks, benefits, and alternatives were discussed: yes     Risks discussed:  Bleeding, damage to other organs, incomplete drainage, pain and infection   Alternatives discussed:  No treatment Universal protocol:    Procedure explained and questions answered to patient or proxy's satisfaction: yes     Relevant documents present and verified: yes     Patient identity confirmed:  Verbally with patient Location:    Type:  Abscess   Size:  2   Location:  Head   Head location:  Scalp Pre-procedure details:    Skin preparation:  Povidone-iodine Anesthesia:    Anesthesia method:  Local infiltration   Local  anesthetic:  Lidocaine  2% WITH epi Procedure type:    Complexity:  Complex Procedure details:    Ultrasound guidance: no     Needle aspiration: no     Incision types:  Stab incision   Incision depth:  Subcutaneous   Wound management:  Probed and deloculated and irrigated with saline   Drainage:  Purulent   Drainage amount:  Copious   Wound treatment:  Wound left open   Packing materials:  None Post-procedure details:    Procedure completion:  Tolerated     Medications Ordered in ED Medications  lidocaine -EPINEPHrine  (XYLOCAINE  W/EPI) 2 %-1:200000 (PF) injection 20 mL (has no administration in time range)  HYDROcodone -acetaminophen  (NORCO/VICODIN) 5-325 MG per tablet 1 tablet (has no administration in time range)    ED Course/ Medical Decision Making/ A&P  Medical Decision Making Amount and/or Complexity of Data Reviewed Labs: ordered. Decision-making details documented in ED Course. Radiology: ordered and independent interpretation performed. Decision-making details documented in ED Course. ECG/medicine tests: ordered and independent interpretation performed. Decision-making details documented in ED Course.  Risk Prescription drug management.   Draining wound to scalp x 2 days.  Stable vitals.  No fever.  Neurological exam is nonfocal.  Incision and drainage performed as above with extensive amount of purulence.  Wound irrigated.  Will give prophylactic antibiotics.  Discussed with warm soaks for the next several days and wound check next week.  Imaging obtained in triage is negative for deep space infection or bony destruction of her skull.  CT head reviewed by myself.  Continue antibiotics.  Continue warm soaks and local wound care.  Follow-up for wound check in 2 days.  Return precautions discussed.        Final Clinical Impression(s) / ED Diagnoses Final diagnoses:  Scalp abscess    Rx / DC Orders ED Discharge Orders      None         Nicanor Mendolia, Mara [place], MD 04/20/24 [phone]
